# Patient Record
Sex: Male | Born: 1955 | Race: Black or African American | Hispanic: No | Marital: Single | Smoking: Current every day smoker
Health system: Southern US, Community
[De-identification: ages and names within clinical notes are randomized; demographics above are authoritative.]

## PROBLEM LIST (undated history)

## (undated) DIAGNOSIS — R569 Unspecified convulsions: Secondary | ICD-10-CM

## (undated) DIAGNOSIS — I10 Essential (primary) hypertension: Secondary | ICD-10-CM

## (undated) DIAGNOSIS — F329 Major depressive disorder, single episode, unspecified: Secondary | ICD-10-CM

## (undated) DIAGNOSIS — E785 Hyperlipidemia, unspecified: Secondary | ICD-10-CM

## (undated) DIAGNOSIS — M810 Age-related osteoporosis without current pathological fracture: Secondary | ICD-10-CM

## (undated) DIAGNOSIS — R519 Headache, unspecified: Secondary | ICD-10-CM

## (undated) DIAGNOSIS — M1990 Unspecified osteoarthritis, unspecified site: Secondary | ICD-10-CM

## (undated) DIAGNOSIS — M542 Cervicalgia: Secondary | ICD-10-CM

## (undated) DIAGNOSIS — F1910 Other psychoactive substance abuse, uncomplicated: Secondary | ICD-10-CM

## (undated) DIAGNOSIS — F419 Anxiety disorder, unspecified: Secondary | ICD-10-CM

## (undated) DIAGNOSIS — G8929 Other chronic pain: Secondary | ICD-10-CM

## (undated) DIAGNOSIS — R51 Headache: Secondary | ICD-10-CM

## (undated) DIAGNOSIS — F32A Depression, unspecified: Secondary | ICD-10-CM

## (undated) DIAGNOSIS — M549 Dorsalgia, unspecified: Secondary | ICD-10-CM

## (undated) DIAGNOSIS — G40909 Epilepsy, unspecified, not intractable, without status epilepticus: Secondary | ICD-10-CM

## (undated) DIAGNOSIS — F4310 Post-traumatic stress disorder, unspecified: Secondary | ICD-10-CM

## (undated) DIAGNOSIS — K219 Gastro-esophageal reflux disease without esophagitis: Secondary | ICD-10-CM

## (undated) HISTORY — DX: Unspecified osteoarthritis, unspecified site: M19.90

## (undated) HISTORY — DX: Essential (primary) hypertension: I10

## (undated) HISTORY — DX: Hyperlipidemia, unspecified: E78.5

## (undated) HISTORY — DX: Anxiety disorder, unspecified: F41.9

## (undated) HISTORY — DX: Age-related osteoporosis without current pathological fracture: M81.0

## (undated) HISTORY — DX: Other chronic pain: G89.29

## (undated) HISTORY — DX: Unspecified convulsions: R56.9

## (undated) HISTORY — DX: Post-traumatic stress disorder, unspecified: F43.10

## (undated) HISTORY — DX: Gastro-esophageal reflux disease without esophagitis: K21.9

## (undated) HISTORY — DX: Headache, unspecified: R51.9

## (undated) HISTORY — DX: Other psychoactive substance abuse, uncomplicated: F19.10

## (undated) HISTORY — DX: Cervicalgia: M54.2

## (undated) HISTORY — DX: Depression, unspecified: F32.A

## (undated) HISTORY — DX: Headache: R51

## (undated) HISTORY — DX: Major depressive disorder, single episode, unspecified: F32.9

## (undated) HISTORY — PX: OTHER SURGICAL HISTORY: SHX169

## (undated) HISTORY — DX: Dorsalgia, unspecified: M54.9

---

## 1999-10-12 ENCOUNTER — Encounter: Payer: Self-pay | Admitting: Emergency Medicine

## 1999-10-12 ENCOUNTER — Emergency Department (HOSPITAL_COMMUNITY): Admission: EM | Admit: 1999-10-12 | Discharge: 1999-10-12 | Payer: Self-pay | Admitting: Emergency Medicine

## 2000-02-29 ENCOUNTER — Emergency Department (HOSPITAL_COMMUNITY): Admission: EM | Admit: 2000-02-29 | Discharge: 2000-02-29 | Payer: Self-pay | Admitting: Emergency Medicine

## 2000-03-30 ENCOUNTER — Encounter: Payer: Self-pay | Admitting: Emergency Medicine

## 2000-03-30 ENCOUNTER — Emergency Department (HOSPITAL_COMMUNITY): Admission: EM | Admit: 2000-03-30 | Discharge: 2000-03-30 | Payer: Self-pay

## 2001-04-21 ENCOUNTER — Emergency Department (HOSPITAL_COMMUNITY): Admission: EM | Admit: 2001-04-21 | Discharge: 2001-04-21 | Payer: Self-pay | Admitting: Emergency Medicine

## 2001-04-21 ENCOUNTER — Encounter: Payer: Self-pay | Admitting: Emergency Medicine

## 2001-04-21 ENCOUNTER — Encounter: Payer: Self-pay | Admitting: Internal Medicine

## 2002-03-14 ENCOUNTER — Emergency Department (HOSPITAL_COMMUNITY): Admission: EM | Admit: 2002-03-14 | Discharge: 2002-03-14 | Payer: Self-pay | Admitting: Emergency Medicine

## 2004-03-20 ENCOUNTER — Emergency Department (HOSPITAL_COMMUNITY): Admission: EM | Admit: 2004-03-20 | Discharge: 2004-03-21 | Payer: Self-pay | Admitting: Family Medicine

## 2012-04-27 ENCOUNTER — Emergency Department (HOSPITAL_COMMUNITY): Payer: Self-pay

## 2012-04-27 ENCOUNTER — Emergency Department (HOSPITAL_COMMUNITY)
Admission: EM | Admit: 2012-04-27 | Discharge: 2012-04-27 | Disposition: A | Discharge status: Home / Self Care | Payer: Self-pay | Attending: Emergency Medicine | Admitting: Emergency Medicine

## 2012-04-27 ENCOUNTER — Encounter (HOSPITAL_COMMUNITY): Payer: Self-pay | Admitting: *Deleted

## 2012-04-27 DIAGNOSIS — S21119A Laceration without foreign body of unspecified front wall of thorax without penetration into thoracic cavity, initial encounter: Secondary | ICD-10-CM

## 2012-04-27 DIAGNOSIS — R079 Chest pain, unspecified: Secondary | ICD-10-CM | POA: Insufficient documentation

## 2012-04-27 DIAGNOSIS — S21109A Unspecified open wound of unspecified front wall of thorax without penetration into thoracic cavity, initial encounter: Secondary | ICD-10-CM | POA: Insufficient documentation

## 2012-04-27 DIAGNOSIS — G40909 Epilepsy, unspecified, not intractable, without status epilepticus: Secondary | ICD-10-CM | POA: Insufficient documentation

## 2012-04-27 HISTORY — DX: Epilepsy, unspecified, not intractable, without status epilepticus: G40.909

## 2012-04-27 MED ORDER — NAPROXEN 500 MG PO TABS: 500.0000 mg | ORAL_TABLET | Freq: Two times a day (BID) | ORAL | Status: AC

## 2012-04-27 MED ORDER — IBUPROFEN 200 MG PO TABS
600.0000 mg | ORAL_TABLET | Freq: Once | ORAL | Status: AC
  Administered 2012-04-27: 600 mg via ORAL
  Filled 2012-04-27: qty 3

## 2012-04-27 NOTE — ED Notes (Signed)
[employer] at the bedside 

## 2012-04-27 NOTE — ED Notes (Signed)
Wound assessed by EDP Hyacinth Meeker not level trauma

## 2012-04-27 NOTE — ED Notes (Signed)
Pt. Discharged to home, pt. Alert and oriented, respirations even and regular

## 2012-04-27 NOTE — Discharge Instructions (Signed)
Laceration Care, Adult °A laceration is a cut that goes through all layers of the skin. The cut goes into the tissue beneath the skin. °HOME CARE °For stitches (sutures) or staples: °· Keep the cut clean and dry.  °· If you have a bandage (dressing), change it at least once a day. Change the bandage if it gets wet or dirty, or as told by your doctor.  °· Wash the cut with soap and water 2 times a day. Rinse the cut with water. Pat it dry with a clean towel.  °· Put a thin layer of medicated cream on the cut as told by your doctor.  °· You may shower after the first 24 hours. Do not soak the cut in water until the stitches are removed.  °· Only take medicines as told by your doctor.  °· Have your stitches or staples removed as told by your doctor.  °For skin adhesive strips: °· Keep the cut clean and dry.  °· Do not get the strips wet. You may take a bath, but be careful to keep the cut dry.  °· If the cut gets wet, pat it dry with a clean towel.  °· The strips will fall off on their own. Do not remove the strips that are still stuck to the cut.  °For wound glue: °· You may shower or take baths. Do not soak or scrub the cut. Do not swim. Avoid heavy sweating until the glue falls off on its own. After a shower or bath, pat the cut dry with a clean towel.  °· Do not put medicine on your cut until the glue falls off.  °· If you have a bandage, do not put tape over the glue.  °· Avoid lots of sunlight or tanning lamps until the glue falls off. Put sunscreen on the cut for the first year to reduce your scar.  °· The glue will fall off on its own. Do not pick at the glue.  °You may need a tetanus shot if: °· You cannot remember when you had your last tetanus shot.  °· You have never had a tetanus shot.  °If you need a tetanus shot and you choose not to have one, you may get tetanus. Sickness from tetanus can be serious. °GET HELP RIGHT AWAY IF:  °· Your pain does not get better with medicine.  °· Your arm, hand, leg, or  foot loses feeling (numbness) or changes color.  °· Your cut is bleeding.  °· Your joint feels weak, or you cannot use your joint.  °· You have painful lumps on your body.  °· Your cut is red, puffy (swollen), or painful.  °· You have a red line on the skin near the cut.  °· You have yellowish-white fluid (pus) coming from the cut.  °· You have a fever.  °· You have a bad smell coming from the cut or bandage.  °· Your cut breaks open before or after stitches are removed.  °· You notice something coming out of the cut, such as wood or glass.  °· You cannot move a finger or toe.  °MAKE SURE YOU:  °· Understand these instructions.  °· Will watch your condition.  °· Will get help right away if you are not doing well or get worse.  °Document Released: 05/31/2008 Document Revised: 12/02/2011 Document Reviewed: 06/08/2011 °[employer]® Patient Information ©2012 [employer]. °
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
## 2013-11-07 ENCOUNTER — Encounter (HOSPITAL_COMMUNITY): Payer: Self-pay | Admitting: Emergency Medicine

## 2013-11-07 ENCOUNTER — Emergency Department (HOSPITAL_COMMUNITY): Payer: Self-pay

## 2013-11-07 DIAGNOSIS — S298XXA Other specified injuries of thorax, initial encounter: Secondary | ICD-10-CM | POA: Insufficient documentation

## 2013-11-07 DIAGNOSIS — Z79899 Other long term (current) drug therapy: Secondary | ICD-10-CM | POA: Insufficient documentation

## 2013-11-07 DIAGNOSIS — S0003XA Contusion of scalp, initial encounter: Secondary | ICD-10-CM | POA: Insufficient documentation

## 2013-11-07 DIAGNOSIS — G40909 Epilepsy, unspecified, not intractable, without status epilepticus: Secondary | ICD-10-CM | POA: Insufficient documentation

## 2013-11-07 DIAGNOSIS — H05229 Edema of unspecified orbit: Secondary | ICD-10-CM | POA: Insufficient documentation

## 2013-11-07 DIAGNOSIS — F17200 Nicotine dependence, unspecified, uncomplicated: Secondary | ICD-10-CM | POA: Insufficient documentation

## 2013-11-07 DIAGNOSIS — S022XXA Fracture of nasal bones, initial encounter for closed fracture: Secondary | ICD-10-CM | POA: Insufficient documentation

## 2013-11-07 DIAGNOSIS — S0990XA Unspecified injury of head, initial encounter: Secondary | ICD-10-CM | POA: Insufficient documentation

## 2013-11-07 NOTE — ED Notes (Signed)
Pt out of lobby in radiology at this time

## 2013-11-07 NOTE — ED Notes (Signed)
Xray called and indicated facial bones may not be adequate imaging at this time, will hold off on xrays at this time until seen by EDP
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
## 2013-11-08 ENCOUNTER — Emergency Department (HOSPITAL_COMMUNITY)
Admission: EM | Admit: 2013-11-08 | Discharge: 2013-11-08 | Disposition: A | Discharge status: Home / Self Care | Payer: Self-pay | Attending: Emergency Medicine | Admitting: Emergency Medicine

## 2013-11-08 DIAGNOSIS — S022XXA Fracture of nasal bones, initial encounter for closed fracture: Secondary | ICD-10-CM

## 2013-11-08 DIAGNOSIS — H05221 Edema of right orbit: Secondary | ICD-10-CM

## 2013-11-08 DIAGNOSIS — S0083XA Contusion of other part of head, initial encounter: Secondary | ICD-10-CM

## 2013-11-08 MED ORDER — HYDROCODONE-ACETAMINOPHEN 5-325 MG PO TABS: 2.0000 | ORAL_TABLET | ORAL | Status: DC | PRN

## 2013-11-08 MED ORDER — HYDROCODONE-ACETAMINOPHEN 5-325 MG PO TABS
2.0000 | ORAL_TABLET | Freq: Once | ORAL | Status: AC
  Administered 2013-11-08: 2 via ORAL
  Filled 2013-11-08: qty 2
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
## 2013-11-08 NOTE — ED Notes (Signed)
Pt son, Julious Payer, can be reached at [phone]

## 2013-12-15 ENCOUNTER — Encounter (HOSPITAL_COMMUNITY): Payer: Self-pay | Admitting: Emergency Medicine

## 2013-12-15 ENCOUNTER — Emergency Department (HOSPITAL_COMMUNITY)
Admission: EM | Admit: 2013-12-15 | Discharge: 2013-12-15 | Disposition: A | Discharge status: Home / Self Care | Payer: No Typology Code available for payment source | Attending: Emergency Medicine | Admitting: Emergency Medicine

## 2013-12-15 ENCOUNTER — Emergency Department (HOSPITAL_COMMUNITY): Payer: Self-pay

## 2013-12-15 ENCOUNTER — Emergency Department (HOSPITAL_COMMUNITY): Payer: No Typology Code available for payment source

## 2013-12-15 DIAGNOSIS — S161XXA Strain of muscle, fascia and tendon at neck level, initial encounter: Secondary | ICD-10-CM

## 2013-12-15 DIAGNOSIS — Y92410 Unspecified street and highway as the place of occurrence of the external cause: Secondary | ICD-10-CM | POA: Insufficient documentation

## 2013-12-15 DIAGNOSIS — S39012A Strain of muscle, fascia and tendon of lower back, initial encounter: Secondary | ICD-10-CM

## 2013-12-15 DIAGNOSIS — S139XXA Sprain of joints and ligaments of unspecified parts of neck, initial encounter: Secondary | ICD-10-CM | POA: Insufficient documentation

## 2013-12-15 DIAGNOSIS — Z79899 Other long term (current) drug therapy: Secondary | ICD-10-CM | POA: Insufficient documentation

## 2013-12-15 DIAGNOSIS — Y9389 Activity, other specified: Secondary | ICD-10-CM | POA: Insufficient documentation

## 2013-12-15 DIAGNOSIS — IMO0002 Reserved for concepts with insufficient information to code with codable children: Secondary | ICD-10-CM | POA: Insufficient documentation

## 2013-12-15 DIAGNOSIS — S335XXA Sprain of ligaments of lumbar spine, initial encounter: Secondary | ICD-10-CM | POA: Insufficient documentation

## 2013-12-15 DIAGNOSIS — G40909 Epilepsy, unspecified, not intractable, without status epilepticus: Secondary | ICD-10-CM | POA: Insufficient documentation

## 2013-12-15 DIAGNOSIS — F17200 Nicotine dependence, unspecified, uncomplicated: Secondary | ICD-10-CM | POA: Insufficient documentation

## 2013-12-15 DIAGNOSIS — S61209A Unspecified open wound of unspecified finger without damage to nail, initial encounter: Secondary | ICD-10-CM | POA: Insufficient documentation

## 2013-12-15 DIAGNOSIS — S61219A Laceration without foreign body of unspecified finger without damage to nail, initial encounter: Secondary | ICD-10-CM

## 2013-12-15 MED ORDER — CYCLOBENZAPRINE HCL 5 MG PO TABS
5.0000 mg | ORAL_TABLET | Freq: Two times a day (BID) | ORAL | Status: DC | PRN
Start: 2013-12-15 — End: 2023-11-12

## 2013-12-15 MED ORDER — HYDROCODONE-ACETAMINOPHEN 5-325 MG PO TABS: 1.0000 | ORAL_TABLET | ORAL | Status: DC | PRN
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
## 2013-12-15 NOTE — ED Provider Notes (Signed)
Medical screening examination/treatment/procedure(s) were performed by non-physician practitioner and as supervising physician I was immediately available for consultation/collaboration.  EKG Interpretation   None         Charles B. Bernette Mayers, MD 12/15/13 [phone]
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
## 2015-09-15 ENCOUNTER — Encounter (HOSPITAL_COMMUNITY): Payer: Self-pay | Admitting: Emergency Medicine

## 2015-09-15 ENCOUNTER — Emergency Department (HOSPITAL_COMMUNITY)
Admission: EM | Admit: 2015-09-15 | Discharge: 2015-09-15 | Disposition: A | Discharge status: Home / Self Care | Payer: Self-pay | Attending: Emergency Medicine | Admitting: Emergency Medicine

## 2015-09-15 DIAGNOSIS — Z7982 Long term (current) use of aspirin: Secondary | ICD-10-CM | POA: Insufficient documentation

## 2015-09-15 DIAGNOSIS — Z79899 Other long term (current) drug therapy: Secondary | ICD-10-CM | POA: Insufficient documentation

## 2015-09-15 DIAGNOSIS — K2970 Gastritis, unspecified, without bleeding: Secondary | ICD-10-CM

## 2015-09-15 DIAGNOSIS — Z720 Tobacco use: Secondary | ICD-10-CM | POA: Insufficient documentation

## 2015-09-15 LAB — CBC
HEMATOCRIT: 42 % (ref 39.0–52.0)
HEMOGLOBIN: 14.2 g/dL (ref 13.0–17.0)
MCH: 29.6 pg (ref 26.0–34.0)
MCHC: 33.8 g/dL (ref 30.0–36.0)
MCV: 87.7 fL (ref 78.0–100.0)
Platelets: 214 10*3/uL (ref 150–400)
RBC: 4.79 MIL/uL (ref 4.22–5.81)
RDW: 13.6 % (ref 11.5–15.5)
WBC: 10.1 10*3/uL (ref 4.0–10.5)

## 2015-09-15 LAB — URINE MICROSCOPIC-ADD ON

## 2015-09-15 LAB — URINALYSIS, ROUTINE W REFLEX MICROSCOPIC
Glucose, UA: NEGATIVE mg/dL
HGB URINE DIPSTICK: NEGATIVE
Ketones, ur: NEGATIVE mg/dL
Nitrite: NEGATIVE
PH: 5.5 (ref 5.0–8.0)
Protein, ur: NEGATIVE mg/dL
SPECIFIC GRAVITY, URINE: 1.031 — AB (ref 1.005–1.030)
UROBILINOGEN UA: 0.2 mg/dL (ref 0.0–1.0)

## 2015-09-15 LAB — COMPREHENSIVE METABOLIC PANEL
ALK PHOS: 69 U/L (ref 38–126)
ALT: 15 U/L — ABNORMAL LOW (ref 17–63)
ANION GAP: 9 (ref 5–15)
AST: 17 U/L (ref 15–41)
Albumin: 4 g/dL (ref 3.5–5.0)
BUN: 10 mg/dL (ref 6–20)
CHLORIDE: 109 mmol/L (ref 101–111)
CO2: 22 mmol/L (ref 22–32)
CREATININE: 0.89 mg/dL (ref 0.61–1.24)
Calcium: 9.5 mg/dL (ref 8.9–10.3)
GLUCOSE: 99 mg/dL (ref 65–99)
POTASSIUM: 3.6 mmol/L (ref 3.5–5.1)
SODIUM: 140 mmol/L (ref 135–145)
Total Bilirubin: 0.8 mg/dL (ref 0.3–1.2)
Total Protein: 7.3 g/dL (ref 6.5–8.1)

## 2015-09-15 LAB — LIPASE, BLOOD: Lipase: 21 U/L — ABNORMAL LOW (ref 22–51)

## 2015-09-15 LAB — POC OCCULT BLOOD, ED: Fecal Occult Bld: NEGATIVE

## 2015-09-15 MED ORDER — SODIUM CHLORIDE 0.9 % IV BOLUS (SEPSIS)
1000.0000 mL | Freq: Once | INTRAVENOUS | Status: AC
  Administered 2015-09-15: 1000 mL via INTRAVENOUS

## 2015-09-15 MED ORDER — PANTOPRAZOLE SODIUM 40 MG IV SOLR
40.0000 mg | Freq: Once | INTRAVENOUS | Status: AC
  Administered 2015-09-15: 40 mg via INTRAVENOUS
  Filled 2015-09-15: qty 40

## 2015-09-15 MED ORDER — ONDANSETRON HCL 4 MG PO TABS: 4.0000 mg | ORAL_TABLET | Freq: Four times a day (QID) | ORAL | Status: DC

## 2015-09-15 MED ORDER — ONDANSETRON HCL 4 MG/2ML IJ SOLN
4.0000 mg | Freq: Once | INTRAMUSCULAR | Status: AC
  Administered 2015-09-15: 4 mg via INTRAVENOUS
  Filled 2015-09-15: qty 2

## 2015-09-15 NOTE — ED Notes (Signed)
Patient states he has nausea and emesis since last Sunday.   Patient states he thinks he may have an ulcer.   Patient denies abdominal pain or other symptoms.

## 2015-09-15 NOTE — Discharge Instructions (Signed)
Gastritis, Adult Gastritis is soreness and puffiness (inflammation) of the lining of the stomach. If you do not get help, gastritis can cause bleeding and sores (ulcers) in the stomach. HOME CARE   Only take medicine as told by your doctor.  If you were given antibiotic medicines, take them as told. Finish the medicines even if you start to feel better.  Drink enough fluids to keep your pee (urine) clear or pale yellow.  Avoid foods and drinks that make your problems worse. Foods you may want to avoid include:  Caffeine or alcohol.  Chocolate.  Mint.  Garlic and onions.  Spicy foods.  Citrus fruits, including oranges, lemons, or limes.  Food containing tomatoes, including sauce, chili, salsa, and pizza.  Fried and fatty foods.  Eat small meals throughout the day instead of large meals. GET HELP RIGHT AWAY IF:   You have black or dark red poop (stools).  You throw up (vomit) blood. It may look like coffee grounds.  You cannot keep fluids down.  Your belly (abdominal) pain gets worse.  You have a fever.  You do not feel better after 1 week.  You have any other questions or concerns. MAKE SURE YOU:   Understand these instructions.  Will watch your condition.  Will get help right away if you are not doing well or get worse. Document Released: 05/31/2008 Document Revised: 03/06/2012 Document Reviewed: 01/26/2012 [facility] Patient Information 2015 [place], [place]. This information is not intended to replace advice given to you by your health care provider. Make sure you discuss any questions you have with your health care provider.  Follow-up with GI if needed. Return to the emergency department if you experience fevers, bloody vomit, diarrhea, shortness of breath, numbness, weakness.

## 2015-09-15 NOTE — ED Provider Notes (Signed)
CSN: [id number]     Arrival date & time 09/15/15  0174 History   First MD Initiated Contact with Patient 09/15/15 0827     Chief Complaint  Patient presents with  . Nausea  . Emesis     (Consider location/radiation/quality/duration/timing/severity/associated sxs/prior Treatment) HPI Comments: Lance Walters is a 59 y.o M with a history of recent incarceration, epilepsy, PTSD who presents the emergency department today complaining of nausea, vomiting 1 week. Patient states that one week ago he began to feel very nauseous and has vomited 3 times since then. Nausea still persists. Patient states his emesis appeared grossly red "looks like catchup". Patient has had 2-3 bowel movements in the last week. All appearing dark black and sticky. Last episode of vomiting was last night. Patient states he felt a burning sensation in his esophagus so he took some antacid which provided complete relief of burning sensation. Denies history of gastric ulcers. Patient has never had a colonoscopy. Denies fever, shortness of breath, lightheadedness, dizziness, numbness, weakness, chest pain, syncope, abdominal pain, headache, difficulty swallowing.  Patient is a 59 y.o. male presenting with vomiting. The history is provided by the patient.  Emesis   Past Medical History  Diagnosis Date  . Epilepsy   . Epilepsy    History reviewed. No pertinent past surgical history. No family history on file. Social History  Substance Use Topics  . Smoking status: Current Every Day Smoker -- 1.00 packs/day    Types: Cigarettes  . Smokeless tobacco: Never Used  . Alcohol Use: No    Review of Systems  Gastrointestinal: Positive for vomiting.  Genitourinary: Negative for dysuria.  All other systems reviewed and are negative.     Allergies  Review of patient's allergies indicates no known allergies.  Home Medications   Prior to Admission medications   Medication Sig Start Date End Date Taking? Authorizing  Provider  Aspirin-Salicylamide-Caffeine (BC HEADACHE POWDER PO) Take 1 Package by mouth as needed (headache).    Historical Provider, MD  cyclobenzaprine (FLEXERIL) 5 MG tablet Take 1 tablet (5 mg total) by mouth 2 (two) times daily as needed for muscle spasms. 12/15/13   Glendell Docker, NP  HYDROcodone-acetaminophen (NORCO/VICODIN) 5-325 MG per tablet Take 1-2 tablets by mouth every 4 (four) hours as needed. 12/15/13   Glendell Docker, NP  phenytoin (DILANTIN) 100 MG ER capsule Take 100 mg by mouth 3 (three) times daily.    Historical Provider, MD   BP 131/84 mmHg  Pulse 66  Temp(Src) 98.1 F (36.7 C) (Oral)  Resp 22  Wt 218 lb (98.884 kg)  SpO2 99% Physical Exam  Constitutional: He is oriented to person, place, and time. He appears well-developed and well-nourished. No distress.  HENT:  Head: Normocephalic and atraumatic.  Mouth/Throat: Oropharynx is clear and moist. No oropharyngeal exudate.  Eyes: Conjunctivae and EOM are normal. Pupils are equal, round, and reactive to light. Right eye exhibits no discharge. Left eye exhibits no discharge. No scleral icterus.  Neck: Normal range of motion. Neck supple.  No meningismus.  Cardiovascular: Normal rate, regular rhythm, normal heart sounds and intact distal pulses.  Exam reveals no gallop and no friction rub.   No murmur heard. Pulmonary/Chest: Effort normal and breath sounds normal. No respiratory distress. He has no wheezes. He has no rales. He exhibits no tenderness.  Abdominal: Soft. Bowel sounds are normal. He exhibits no distension and no mass. There is no tenderness. There is no rebound and no guarding.  Negative Murphy sign. No peritoneal  signs.  Genitourinary: Rectum normal and prostate normal.  Good rectal tone. Minimal stool in rectal vault.  Musculoskeletal: Normal range of motion. He exhibits no edema.  Neurological: He is alert and oriented to person, place, and time. No cranial nerve deficit.  Strength 5/5 throughout.  No sensory deficits.    Skin: Skin is warm and dry. No rash noted. He is not diaphoretic. No erythema. No pallor.  Psychiatric: He has a normal mood and affect. His behavior is normal.  Nursing note and vitals reviewed.   ED Course  Procedures (including critical care time) Labs Review Labs Reviewed  LIPASE, BLOOD - Abnormal; Notable for the following:    Lipase 21 (*)    All other components within normal limits  COMPREHENSIVE METABOLIC PANEL - Abnormal; Notable for the following:    ALT 15 (*)    All other components within normal limits  URINALYSIS, ROUTINE W REFLEX MICROSCOPIC (NOT AT [facility]) - Abnormal; Notable for the following:    Color, Urine AMBER (*)    Specific Gravity, Urine 1.031 (*)    Bilirubin Urine SMALL (*)    Leukocytes, UA SMALL (*)    All other components within normal limits  URINE MICROSCOPIC-ADD ON - Abnormal; Notable for the following:    Squamous Epithelial / LPF FEW (*)    Bacteria, UA FEW (*)    Crystals CA OXALATE CRYSTALS (*)    All other components within normal limits  CBC  POC OCCULT BLOOD, ED    Imaging Review No results found. I have personally reviewed and evaluated these images and lab results as part of my medical decision-making.   EKG Interpretation None      MDM   Final diagnoses:  Gastritis    Patient seen for nausea, emesis, dark stools 1 week. Concern for GI bleed. She given IV fluids, Zofran, IV Protonix. Hemoccult negative. Patient's symptoms resolved after receiving medication. Hemoglobin stable. Do not suspect acute bleed. Hemodynamically stable. VSS. As best treatment plan with patient who is agreeable. Return precautions outlined in patient discharge instructions.  Case management consult to due to patient's homelessness and inability to obtain medications.  Patient was discussed with and seen by Dr. Alfonse Spruce who agrees with the treatment plan.      Dondra Spry Marquez, PA-C 09/15/15 1528  Harvel Quale,  MD 09/15/15 [phone]

## 2015-09-15 NOTE — Care Management Note (Signed)
Case Management Note  Patient Details  Name: Lance Walters MRN: [id number] Date of Birth: 1956-11-19  Subjective/Objective:                  59 y.o M with a history of recent incarceration, epilepsy, PTSD who presents the emergency department today complaining of nausea, vomiting 1 week. //Lives at homeless shelter  Action/Plan: Follow for disposition needs.  Expected Discharge Date:        09/15/15          Expected Discharge Plan:  Homeless Shelter  In-House Referral:  PCP / [facility]  Discharge planning Services  [place] / [facility] (established/new), CM Consult  Post Acute Care Choice:  NA Choice offered to:  NA  DME Arranged:    DME Agency:     HH Arranged:  NA HH Agency:     Status of Service:  Completed, signed off  Medicare Important Message Given:    Date Medicare IM Given:    Medicare IM give by:    Date Additional Medicare IM Given:    Additional Medicare Important Message give by:     If discussed at [place] of Stay Meetings, dates discussed:    Additional Comments: NCM consulted to regarding medications. Pt is established at [facility] and [place].  NCM consulted Saintclair Halsted, [place] Specialist to assist pt with [facility] application process.  Fuller Mandril, RN 09/15/2015, 10:20 AM

## 2016-01-29 ENCOUNTER — Encounter: Payer: Self-pay | Admitting: Internal Medicine

## 2016-02-13 ENCOUNTER — Ambulatory Visit (AMBULATORY_SURGERY_CENTER): Payer: Self-pay | Admitting: *Deleted

## 2016-02-13 ENCOUNTER — Telehealth: Payer: Self-pay | Admitting: *Deleted

## 2016-02-13 VITALS — Ht 71.0 in | Wt 180.0 lb

## 2016-02-13 DIAGNOSIS — Z1211 Encounter for screening for malignant neoplasm of colon: Secondary | ICD-10-CM

## 2016-02-13 MED ORDER — SUPREP BOWEL PREP KIT 17.5-3.13-1.6 GM/177ML PO SOLN: 1.0000 | Freq: Once | ORAL | Status: DC

## 2016-02-13 NOTE — Telephone Encounter (Signed)
John,  I had a PV today with this patient.  He has a history of seizures but none since 2015 when he was placed on Keppra.  I wanted to let you know that he uses cocaine on weekends.  Last use was 02/07/16.  [place] for colonoscopy?  Please advise.

## 2016-02-13 NOTE — Progress Notes (Signed)
Patient denies any allergies to egg or soy products. No prior surgical history. Patient denies oxygen use at home and denies diet medications. Emmi instructions for colonoscopy explained but patient denied.

## 2016-02-16 NOTE — Telephone Encounter (Signed)
Lance Walters,  This pt is cleared for anesthetic care at [facility].  Thanks,  Osvaldo Angst

## 2016-02-26 ENCOUNTER — Encounter: Payer: Medicaid Other | Admitting: Internal Medicine

## 2016-02-26 ENCOUNTER — Telehealth: Payer: Self-pay | Admitting: Internal Medicine

## 2016-02-27 ENCOUNTER — Encounter: Payer: Self-pay | Admitting: Internal Medicine

## 2016-04-01 NOTE — Telephone Encounter (Signed)
Due to Insurance Pt NOT BILLED Colon Cx Fee

## 2016-04-20 ENCOUNTER — Encounter: Payer: Medicaid Other | Admitting: Internal Medicine

## 2016-05-03 ENCOUNTER — Encounter: Payer: Self-pay | Admitting: Specialist

## 2018-01-27 ENCOUNTER — Encounter: Payer: Self-pay | Admitting: Pediatric Intensive Care

## 2018-01-30 ENCOUNTER — Encounter: Payer: Self-pay | Admitting: Pediatric Intensive Care

## 2018-01-30 LAB — TB SKIN TEST: TB SKIN TEST: NEGATIVE

## 2018-02-14 NOTE — Congregational Nurse Program (Signed)
Congregational Nurse Program Note  Date of Encounter: 01/27/2018  Past Medical History: Past Medical History:  Diagnosis Date  . Anxiety   . Chronic neck and back pain   . Depression   . Epilepsy ([place])   . Epilepsy ([place])   . Frequent headaches   . GERD (gastroesophageal reflux disease)   . Hyperlipidemia    diet controlled, no medication  . PTSD (post-traumatic stress disorder)   . Seizures ([place])    last one 2015, controlled with med   . Substance abuse    history cocaine use    Encounter Details: CNP Questionnaire - 01/27/18 0900      Questionnaire   Patient Status  Not Applicable    Race  Black or African American    Location Patient Served At  [employer]  Not Applicable    Uninsured  Uninsured (NEW 1x/quarter)    Food  Yes, have food insecurities    Housing/Utilities  No permanent housing    Transportation  No transportation needs    Interpersonal Safety  Yes, feel physically and emotionally safe where you currently live    Medication  No medication insecurities    Medical Provider  Yes    Referrals  Not Applicable    ED Visit Averted  Not Applicable    Life-Saving Intervention Made  Not Applicable     Needs PPD placement for housing. Placed per protocol in right forearm. Client advised to RTC on Monday for reading.

## 2018-02-14 NOTE — Congregational Nurse Program (Signed)
Congregational Nurse Program Note  Date of Encounter: 01/30/2018  Past Medical History: Past Medical History:  Diagnosis Date  . Anxiety   . Chronic neck and back pain   . Depression   . Epilepsy ([place])   . Epilepsy ([place])   . Frequent headaches   . GERD (gastroesophageal reflux disease)   . Hyperlipidemia    diet controlled, no medication  . PTSD (post-traumatic stress disorder)   . Seizures ([place])    last one 2015, controlled with med   . Substance abuse    history cocaine use    Encounter Details: CNP Questionnaire - 01/30/18 0830      Questionnaire   Patient Status  Not Applicable    Race  Black or African American    Location Patient Served At  [employer]  Not Applicable    Uninsured  Uninsured (Subsequent visits/quarter)    Food  Yes, have food insecurities    Housing/Utilities  No permanent housing    Transportation  No transportation needs    Interpersonal Safety  Yes, feel physically and emotionally safe where you currently live    Medication  No medication insecurities    Medical Provider  Yes    Referrals  Not Applicable    ED Visit Averted  Not Applicable    Life-Saving Intervention Made  Not Applicable     Clien in for TB test read. No induration noted at site. Copy of test results given.

## 2019-07-28 ENCOUNTER — Emergency Department (HOSPITAL_COMMUNITY)
Admission: EM | Admit: 2019-07-28 | Discharge: 2019-07-28 | Discharge status: Against Medical Advice | Payer: Self-pay | Attending: Emergency Medicine | Admitting: Emergency Medicine

## 2019-07-28 ENCOUNTER — Other Ambulatory Visit: Payer: Self-pay

## 2019-07-28 ENCOUNTER — Encounter (HOSPITAL_COMMUNITY): Payer: Self-pay

## 2019-07-28 DIAGNOSIS — Z5321 Procedure and treatment not carried out due to patient leaving prior to being seen by health care provider: Secondary | ICD-10-CM | POA: Insufficient documentation

## 2019-07-28 NOTE — ED Notes (Signed)
Pt called for updated vitals x 1
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
## 2020-08-15 DIAGNOSIS — I10 Essential (primary) hypertension: Secondary | ICD-10-CM | POA: Insufficient documentation

## 2020-08-15 DIAGNOSIS — G40909 Epilepsy, unspecified, not intractable, without status epilepticus: Secondary | ICD-10-CM | POA: Insufficient documentation

## 2020-08-15 DIAGNOSIS — N400 Enlarged prostate without lower urinary tract symptoms: Secondary | ICD-10-CM | POA: Insufficient documentation

## 2020-08-15 DIAGNOSIS — M549 Dorsalgia, unspecified: Secondary | ICD-10-CM | POA: Insufficient documentation

## 2020-08-15 DIAGNOSIS — F1990 Other psychoactive substance use, unspecified, uncomplicated: Secondary | ICD-10-CM | POA: Insufficient documentation

## 2020-08-15 DIAGNOSIS — F1490 Cocaine use, unspecified, uncomplicated: Secondary | ICD-10-CM | POA: Insufficient documentation

## 2020-09-18 DIAGNOSIS — M4802 Spinal stenosis, cervical region: Secondary | ICD-10-CM | POA: Insufficient documentation

## 2020-09-18 DIAGNOSIS — R944 Abnormal results of kidney function studies: Secondary | ICD-10-CM | POA: Insufficient documentation

## 2020-09-18 DIAGNOSIS — M4807 Spinal stenosis, lumbosacral region: Secondary | ICD-10-CM | POA: Insufficient documentation

## 2020-09-18 DIAGNOSIS — G629 Polyneuropathy, unspecified: Secondary | ICD-10-CM | POA: Insufficient documentation

## 2020-09-18 DIAGNOSIS — M1611 Unilateral primary osteoarthritis, right hip: Secondary | ICD-10-CM | POA: Insufficient documentation

## 2021-02-04 DIAGNOSIS — G629 Polyneuropathy, unspecified: Secondary | ICD-10-CM | POA: Insufficient documentation

## 2021-09-21 DIAGNOSIS — R195 Other fecal abnormalities: Secondary | ICD-10-CM | POA: Insufficient documentation

## 2021-11-24 LAB — CBC: RBC: 4.94 (ref 3.87–5.11)

## 2021-11-24 LAB — BASIC METABOLIC PANEL
BUN: 18 (ref 4–21)
CO2: 19 (ref 13–22)
Chloride: 105 (ref 99–108)
Creatinine: 1.4 — AB (ref 0.6–1.3)
Glucose: 135
Potassium: 4.3 (ref 3.4–5.3)
Sodium: 139 (ref 137–147)

## 2021-11-24 LAB — CBC AND DIFFERENTIAL
HCT: 45 (ref 41–53)
Hemoglobin: 14.6 (ref 13.5–17.5)
Platelets: 231 (ref 150–399)
WBC: 7.1

## 2021-11-24 LAB — HEPATIC FUNCTION PANEL
ALT: 8 — AB (ref 10–40)
AST: 14 (ref 14–40)
Alkaline Phosphatase: 68 (ref 25–125)
Bilirubin, Total: 0.3

## 2021-11-24 LAB — COMPREHENSIVE METABOLIC PANEL
Albumin: 4.2 (ref 3.5–5.0)
Calcium: 8.7 (ref 8.7–10.7)
GFR calc Af Amer: 58
Globulin: 2.3

## 2021-12-14 DIAGNOSIS — R1032 Left lower quadrant pain: Secondary | ICD-10-CM | POA: Insufficient documentation

## 2021-12-14 DIAGNOSIS — R8281 Pyuria: Secondary | ICD-10-CM | POA: Insufficient documentation

## 2021-12-14 DIAGNOSIS — R159 Full incontinence of feces: Secondary | ICD-10-CM | POA: Insufficient documentation

## 2021-12-17 ENCOUNTER — Other Ambulatory Visit: Payer: Self-pay | Admitting: Physician Assistant

## 2021-12-17 DIAGNOSIS — R1032 Left lower quadrant pain: Secondary | ICD-10-CM

## 2021-12-17 DIAGNOSIS — N390 Urinary tract infection, site not specified: Secondary | ICD-10-CM

## 2021-12-17 DIAGNOSIS — R159 Full incontinence of feces: Secondary | ICD-10-CM

## 2021-12-17 DIAGNOSIS — R8281 Pyuria: Secondary | ICD-10-CM

## 2021-12-17 DIAGNOSIS — R195 Other fecal abnormalities: Secondary | ICD-10-CM

## 2021-12-25 ENCOUNTER — Ambulatory Visit: Payer: Self-pay | Admitting: Nurse Practitioner

## 2021-12-31 ENCOUNTER — Other Ambulatory Visit: Payer: Self-pay

## 2022-01-07 ENCOUNTER — Ambulatory Visit: Payer: Medicare Other | Admitting: Nurse Practitioner

## 2022-01-13 ENCOUNTER — Inpatient Hospital Stay: Admission: RE | Admit: 2022-01-13 | Payer: Self-pay | Source: Ambulatory Visit

## 2022-01-25 ENCOUNTER — Other Ambulatory Visit (INDEPENDENT_AMBULATORY_CARE_PROVIDER_SITE_OTHER): Payer: Medicare Other

## 2022-01-25 ENCOUNTER — Ambulatory Visit (INDEPENDENT_AMBULATORY_CARE_PROVIDER_SITE_OTHER): Payer: Medicare Other | Admitting: Nurse Practitioner

## 2022-01-25 ENCOUNTER — Encounter: Payer: Self-pay | Admitting: Nurse Practitioner

## 2022-01-25 VITALS — BP 130/80 | HR 76 | Ht 69.0 in | Wt 173.0 lb

## 2022-01-25 DIAGNOSIS — R197 Diarrhea, unspecified: Secondary | ICD-10-CM

## 2022-01-25 DIAGNOSIS — K921 Melena: Secondary | ICD-10-CM

## 2022-01-25 DIAGNOSIS — R1030 Lower abdominal pain, unspecified: Secondary | ICD-10-CM

## 2022-01-25 DIAGNOSIS — K625 Hemorrhage of anus and rectum: Secondary | ICD-10-CM

## 2022-01-25 LAB — CBC WITH DIFFERENTIAL/PLATELET
Basophils Absolute: 0.1 10*3/uL (ref 0.0–0.1)
Basophils Relative: 1.1 % (ref 0.0–3.0)
Eosinophils Absolute: 0.1 10*3/uL (ref 0.0–0.7)
Eosinophils Relative: 1.6 % (ref 0.0–5.0)
HCT: 42.3 % (ref 39.0–52.0)
Hemoglobin: 13.9 g/dL (ref 13.0–17.0)
Lymphocytes Relative: 28 % (ref 12.0–46.0)
Lymphs Abs: 1.9 10*3/uL (ref 0.7–4.0)
MCHC: 32.8 g/dL (ref 30.0–36.0)
MCV: 90.2 fl (ref 78.0–100.0)
Monocytes Absolute: 0.5 10*3/uL (ref 0.1–1.0)
Monocytes Relative: 6.7 % (ref 3.0–12.0)
Neutro Abs: 4.3 10*3/uL (ref 1.4–7.7)
Neutrophils Relative %: 62.6 % (ref 43.0–77.0)
Platelets: 183 10*3/uL (ref 150.0–400.0)
RBC: 4.69 Mil/uL (ref 4.22–5.81)
RDW: 13 % (ref 11.5–15.5)
WBC: 6.9 10*3/uL (ref 4.0–10.5)

## 2022-01-25 LAB — COMPREHENSIVE METABOLIC PANEL
ALT: 8 U/L (ref 0–53)
AST: 12 U/L (ref 0–37)
Albumin: 4.1 g/dL (ref 3.5–5.2)
Alkaline Phosphatase: 57 U/L (ref 39–117)
BUN: 27 mg/dL — ABNORMAL HIGH (ref 6–23)
CO2: 24 mEq/L (ref 19–32)
Calcium: 8.9 mg/dL (ref 8.4–10.5)
Chloride: 108 mEq/L (ref 96–112)
Creatinine, Ser: 1.23 mg/dL (ref 0.40–1.50)
GFR: 61.75 mL/min (ref >60.00)
Glucose, Bld: 95 mg/dL (ref 70–99)
Potassium: 4.4 mEq/L (ref 3.5–5.1)
Sodium: 139 mEq/L (ref 135–145)
Total Bilirubin: 0.4 mg/dL (ref 0.2–1.2)
Total Protein: 6.9 g/dL (ref 6.0–8.3)

## 2022-01-25 MED ORDER — NA SULFATE-K SULFATE-MG SULF 17.5-3.13-1.6 GM/177ML PO SOLN: 1.0000 | ORAL | 0 refills | Status: DC

## 2022-01-25 NOTE — Progress Notes (Signed)
01/25/2022 Alphonzo Dublin [id number] 1956-04-19   CHIEF COMPLAINT: Diarrhea, rectal bleeding   HISTORY OF PRESENT ILLNESS: Kamareon Sciandra is a 66 year old male with a past medical history of anxiety, depression, PTSD, epilepsy (last seizure was 20+ years ago), headaches, substance use disorder (abstinent from cocaine x 5 years) and spinal stenosis. He presents to our office today as referred by Particia Nearing PA-C for further evaluation regarding abdominal pain, diarrhea and hematochezia. He previously passed a normal brown formed stool daily. His bowel pattern changed a few months ago when he started passing urgent watery, mud like to pasty loose stools several times daily. He reported seeing bright red blood mixed on the stool, in the toilet water and on the toilet tissue after each bowel movement for 2 months which has decreased over the past few weeks. No noticeable rectal bleeding x 1 to 2 weeks. His bowel movements are urgent at time resulting in a few episodes of soiling himself before he could get to the bathroom. He has intermittent LLQ pain which radiates to his lower back.  He denies having any abdominal pain at this time.  He passed a tarry black loose stool in his depends on Saturday 01/23/2022. No recent Pepto bismol use. No iron use. No dysphagia, heartburn or upper abdominal pain. No history of stomach ulcers. He was recently prescribed an antibiotic for hematuria/suspected UTI. He was scheduled to have a colonoscopy with out practice in 2017 which was cancelled because he was incarcerated. He takes Meloxicam once daily, Tylenol PRN and Gabapentin for lower back pain.  He has night sweats for the past few months.  No weight loss.  No known family history of esophageal, gastric or colon cancer.  CBC Latest Ref Rng & Units 11/24/2021 09/15/2015  WBC - 7.1 10.1  Hemoglobin 13.5 - 17.5 14.6 14.2  Hematocrit 41 - 53 45 42.0  Platelets 150 - 399 231 214    CMP Latest Ref Rng  & Units 11/24/2021 09/15/2015  Glucose 65 - 99 mg/dL - 99  BUN 4 - _0 Creatinine 0.6 - 1.3 1.4(A) 0.89  Sodium 137 - 147 139 140  Potassium 3.4 - 5.3 4.3 3.6  Chloride 99 - 108 105 109  CO2 13 - _1 Calcium 8.7 - 10.7 8.7 9.5  Total Protein 6.5 - 8.1 g/dL - 7.3  Total Bilirubin 0.3 - 1.2 mg/dL - 0.8  Alkaline Phos 25 - 125 68 69  AST 14 - 40 14 17  ALT 10 - 40 8(A) 15(L)     Past Surgical History:  Procedure Laterality Date   no prior surgery     Social History: He is married.  He has 2 daughters and 2 sons.  He is on disability.  He smokes cigarettes 1 ppd x 30 years. Past cocaine use, abstinent x 5 years. He drinks one or two cans of beer once weekly.   Family History: No known family history of esophageal, gastric or colon cancer.  Mother with hypertension and heart disease.  Father with hypertension, heart disease and prostate cancer.  No Known Allergies    Outpatient Encounter Medications as of 01/25/2022  Medication Sig   acetaminophen (TYLENOL) 500 MG tablet Take 500 mg by mouth every 8 (eight) hours as needed for moderate pain.   calcium-vitamin D (OSCAL WITH D) 500-200 MG-UNIT per tablet Take 1 tablet by mouth daily with breakfast.   cyclobenzaprine (FLEXERIL) 5 MG tablet  Take 1 tablet (5 mg total) by mouth 2 (two) times daily as needed for muscle spasms.   gabapentin (NEURONTIN) 300 MG capsule Take 100 mg by mouth 3 (three) times daily.   HydrOXYzine Pamoate (VISTARIL PO) Take 1 capsule by mouth at bedtime.   levETIRAcetam (KEPPRA) 1000 MG tablet Take 1,000 mg by mouth daily.   Meloxicam (MOBIC PO) Take 1 tablet by mouth at bedtime.   Na Sulfate-K Sulfate-Mg Sulf (SUPREP BOWEL PREP KIT) 17.5-3.13-1.6 GM/177ML SOLN Take 1 kit by mouth as directed. For colonoscopy prep   sertraline (ZOLOFT) 50 MG tablet Take 150 mg by mouth daily.   tamsulosin (FLOMAX) 0.4 MG CAPS capsule 0.4 mg.   [DISCONTINUED] omeprazole (PRILOSEC) 20 MG capsule Take 20 mg by mouth daily.    No facility-administered encounter medications on file as of 01/25/2022.    REVIEW OF SYSTEMS:  Gen: Denies fever, sweats or chills. No weight loss.  CV: Denies chest pain, palpitations or edema. Resp: Denies cough, shortness of breath of hemoptysis.  GI: See HPI.  GU : See HPI.  MS: + Lower back pain.  Derm: Denies rash, itchiness, skin lesions or unhealing ulcers. Psych: Denies depression, anxiety, memory loss, suicidal ideation and confusion. Heme: Denies bruising, bleeding. Neuro:  + Spinal stenosis. Endo:  Denies any problems with DM, thyroid or adrenal function.  PHYSICAL EXAM: BP 130/80 (BP Location: Left Arm, Patient Position: Sitting, Cuff Size: Normal)    Pulse 76    Ht _0  (1.753 m) Comment: height measured without shoes   Wt 173 lb (78.5 kg)    BMI 25.55 kg/m   General: 66 year old male in no acute distress. Head: Normocephalic and atraumatic. Eyes:  Sclerae non-icteric, conjunctive pink. Ears: Normal auditory acuity. Mouth: Scattered missing dentition.  No ulcers or lesions.  Neck: Supple, no lymphadenopathy or thyromegaly.  Lungs: Clear bilaterally to auscultation without wheezes, crackles or rhonchi. Heart: Regular rate and rhythm. No murmur, rub or gallop appreciated.  Abdomen: Soft, nontender, non distended. No masses. No hepatosplenomegaly. Normoactive bowel sounds x 4 quadrants.  Rectal: Deferred. Musculoskeletal: Symmetrical with no gross deformities. Skin: Warm and dry. No rash or lesions on visible extremities. Extremities: No edema. Neurological: Alert oriented x 4, no focal deficits.  Psychological:  Alert and cooperative. Normal mood and affect.  ASSESSMENT AND PLAN:  75) 66 year old male with left lower quadrant abdominal pain, diarrhea and hematochezia.  No abdominal tenderness on exam today. -Colonoscopy benefits and risks discussed including risk with sedation, risk of bleeding, perforation and infection  -GI pathogen panel with C. Diff  PCR -CBC, CMP -Phillip's bacteria probiotic once daily -Avoid dairy product -CTAP if LLQ pain recurs -Patient contact office if symptoms worsen  2) Melena x 1. + NSAID use.  -EGD to rule out PUD/UGI malignancy benefits and risks discussed including risk with sedation, risk of bleeding, perforation and infection   3) History of Epilepsy, patient reported well controlled on Keppra. Last seizure was 20+ years ago.   4) Night sweats for the past month without fever or weight loss. -Recommend chest/abdominal/pelvic CT if EGD and colonoscopy unrevealing  Further recommendation to be determined after the above evaluation completed   CC:  Placey, Audrea Muscat, NP

## 2022-01-25 NOTE — Patient Instructions (Signed)
LABS:  Lab work has been ordered for you today. Our lab is located in the basement. Press "B" on the elevator. The lab is located at the first door on the left as you exit the elevator.  HEALTHCARE LAWS AND MY CHART RESULTS:  Due to recent changes in healthcare laws, you may see the results of your imaging and laboratory studies on MyChart before your provider has had a chance to review them.   We understand that in some cases there may be results that are confusing or concerning to you. Not all laboratory results come back in the same time frame and the provider may be waiting for multiple results in order to interpret others.  Please give [place] 48 hours in order for your provider to thoroughly review all the results before contacting the office for clarification of your results.   PROCEDURES: You have been scheduled for an EGD and Colonoscopy. Please follow the written instructions given to you at your visit today. Please pick up your prep supplies at the pharmacy within the next 1-3 days. If you use inhalers (even only as needed), please bring them with you on the day of your procedure.  RECOMMENDATIONS:  Hardin Negus Bacteria Probiotic once a day. No dairy products  It was great seeing you today! Thank you for entrusting me with your care and choosing [facility].  Noralyn Pick, CRNP  The [facility] providers would like to encourage you to use [facility] to communicate with providers for non-urgent requests or questions.  Due to long hold times on the telephone, sending your provider a message by [facility] may be faster and more efficient way to get a response. Please allow 48 business hours for a response.  Please remember that this is for non-urgent requests/questions.  If you are age 21 or older, your body mass index should be between 23-30. Your Body mass index is 25.55 kg/m. If this is out of the aforementioned range listed, please consider follow up with your Primary  Care Provider.  If you are age 45 or younger, your body mass index should be between 19-25. Your Body mass index is 25.55 kg/m. If this is out of the aformentioned range listed, please consider follow up with your Primary Care Provider.

## 2022-01-26 ENCOUNTER — Other Ambulatory Visit: Payer: Medicare Other

## 2022-01-26 DIAGNOSIS — K921 Melena: Secondary | ICD-10-CM

## 2022-01-26 DIAGNOSIS — R1030 Lower abdominal pain, unspecified: Secondary | ICD-10-CM

## 2022-01-26 DIAGNOSIS — R197 Diarrhea, unspecified: Secondary | ICD-10-CM

## 2022-01-26 DIAGNOSIS — K625 Hemorrhage of anus and rectum: Secondary | ICD-10-CM

## 2022-01-28 ENCOUNTER — Telehealth: Payer: Self-pay

## 2022-01-28 NOTE — Telephone Encounter (Signed)
Opened in error

## 2022-01-29 LAB — GI PROFILE, STOOL, PCR

## 2022-01-30 NOTE — Progress Notes (Signed)
Addendum: Reviewed and agree with assessment and management plan. Ellieana Dolecki M, MD  

## 2022-02-03 ENCOUNTER — Telehealth: Payer: Self-pay | Admitting: Nurse Practitioner

## 2022-02-03 NOTE — Telephone Encounter (Signed)
Called patient, phone rang a few times then went to a busy signal.

## 2022-02-03 NOTE — Telephone Encounter (Signed)
-----   Message from Noralyn Pick, NP sent at 01/29/2022  5:58 AM EST ----- Deloris Mittag, pls inform patient his GI pathogen panel was negative. Pls let me know if he is still having diarrhea. Thx

## 2022-02-03 NOTE — Telephone Encounter (Signed)
Inbound call from patient, requesting results from 1/31. Please advise.

## 2022-02-10 NOTE — Telephone Encounter (Signed)
LM for patient

## 2022-02-16 ENCOUNTER — Encounter: Payer: Self-pay | Admitting: Internal Medicine

## 2022-02-16 ENCOUNTER — Ambulatory Visit (AMBULATORY_SURGERY_CENTER): Payer: Medicare Other | Admitting: Internal Medicine

## 2022-02-16 VITALS — BP 136/79 | HR 53 | Temp 98.6°F | Resp 12 | Ht 69.0 in | Wt 173.0 lb

## 2022-02-16 DIAGNOSIS — K2970 Gastritis, unspecified, without bleeding: Secondary | ICD-10-CM

## 2022-02-16 DIAGNOSIS — R1030 Lower abdominal pain, unspecified: Secondary | ICD-10-CM | POA: Diagnosis not present

## 2022-02-16 DIAGNOSIS — K635 Polyp of colon: Secondary | ICD-10-CM

## 2022-02-16 DIAGNOSIS — K921 Melena: Secondary | ICD-10-CM

## 2022-02-16 DIAGNOSIS — K648 Other hemorrhoids: Secondary | ICD-10-CM

## 2022-02-16 DIAGNOSIS — R197 Diarrhea, unspecified: Secondary | ICD-10-CM

## 2022-02-16 DIAGNOSIS — D124 Benign neoplasm of descending colon: Secondary | ICD-10-CM

## 2022-02-16 DIAGNOSIS — K625 Hemorrhage of anus and rectum: Secondary | ICD-10-CM

## 2022-02-16 DIAGNOSIS — K5730 Diverticulosis of large intestine without perforation or abscess without bleeding: Secondary | ICD-10-CM | POA: Diagnosis not present

## 2022-02-16 MED ORDER — SODIUM CHLORIDE 0.9 % IV SOLN: 500.0000 mL | Freq: Once | INTRAVENOUS | Status: DC

## 2022-02-16 NOTE — Patient Instructions (Signed)
Handout on polyps given.   YOU HAD AN ENDOSCOPIC PROCEDURE TODAY AT THE [facility]:   Refer to the procedure report that was given to you for any specific questions about what was found during the examination.  If the procedure report does not answer your questions, please call your gastroenterologist to clarify.  If you requested that your care partner not be given the details of your procedure findings, then the procedure report has been included in a sealed envelope for you to review at your convenience later.  YOU SHOULD EXPECT: Some feelings of bloating in the abdomen. Passage of more gas than usual.  Walking can help get rid of the air that was put into your GI tract during the procedure and reduce the bloating. If you had a lower endoscopy (such as a colonoscopy or flexible sigmoidoscopy) you may notice spotting of blood in your stool or on the toilet paper. If you underwent a bowel prep for your procedure, you may not have a normal bowel movement for a few days.  Please Note:  You might notice some irritation and congestion in your nose or some drainage.  This is from the oxygen used during your procedure.  There is no need for concern and it should clear up in a day or so.  SYMPTOMS TO REPORT IMMEDIATELY:   Following lower endoscopy (colonoscopy or flexible sigmoidoscopy):  Excessive amounts of blood in the stool  Significant tenderness or worsening of abdominal pains  Swelling of the abdomen that is new, acute  Fever of 100F or higher   Following upper endoscopy (EGD)  Vomiting of blood or coffee ground material  New chest pain or pain under the shoulder blades  Painful or persistently difficult swallowing  New shortness of breath  Fever of 100F or higher  Black, tarry-looking stools  For urgent or emergent issues, a gastroenterologist can be reached at any hour by calling [phone]. Do not use MyChart messaging for urgent concerns.    DIET:  We do  recommend a small meal at first, but then you may proceed to your regular diet.  Drink plenty of fluids but you should avoid alcoholic beverages for 24 hours.  ACTIVITY:  You should plan to take it easy for the rest of today and you should NOT DRIVE or use heavy machinery until tomorrow (because of the sedation medicines used during the test).    FOLLOW UP: Our staff will call the number listed on your records 48-72 hours following your procedure to check on you and address any questions or concerns that you may have regarding the information given to you following your procedure. If we do not reach you, we will leave a message.  We will attempt to reach you two times.  During this call, we will ask if you have developed any symptoms of COVID 19. If you develop any symptoms (ie: fever, flu-like symptoms, shortness of breath, cough etc.) before then, please call [phone].  If you test positive for Covid 19 in the 2 weeks post procedure, please call and report this information to us.    If any biopsies were taken you will be contacted by phone or by letter within the next 1-3 weeks.  Please call us at [phone] if you have not heard about the biopsies in 3 weeks.    SIGNATURES/CONFIDENTIALITY: You and/or your care partner have signed paperwork which will be entered into your electronic medical record.  These signatures attest to the fact   that that the information above on your After Visit Summary has been reviewed and is understood.  Full responsibility of the confidentiality of this discharge information lies with you and/or your care-partner. 

## 2022-02-16 NOTE — Progress Notes (Signed)
See office note dated 01/25/2022 for details Patient presenting for upper endoscopy and colonoscopy Evaluation of diarrhea, rectal bleeding, melenic stools  The nature of the procedure, as well as the risks, benefits, and alternatives were carefully and thoroughly reviewed with the patient. Ample time for discussion and questions allowed. The patient understood, was satisfied, and agreed to proceed.

## 2022-02-16 NOTE — Op Note (Signed)
[place] Patient Name: Lance Walters Procedure Date: 02/16/2022 3:57 PM MRN: [id number] Endoscopist: Jerene Bears , MD Age: 66 Referring MD:  Date of Birth: [birth year] Gender: Male Account #: [id number] Procedure:                Colonoscopy Indications:              Lower abdominal pain, Rectal bleeding Medicines:                Monitored Anesthesia Care Procedure:                Pre-Anesthesia Assessment:                           - Prior to the procedure, a History and Physical                            was performed, and patient medications and                            allergies were reviewed. The patient's tolerance of                            previous anesthesia was also reviewed. The risks                            and benefits of the procedure and the sedation                            options and risks were discussed with the patient.                            All questions were answered, and informed consent                            was obtained. Prior Anticoagulants: The patient has                            taken no previous anticoagulant or antiplatelet                            agents. ASA Grade Assessment: II - A patient with                            mild systemic disease. After reviewing the risks                            and benefits, the patient was deemed in                            satisfactory condition to undergo the procedure.                           After obtaining informed consent, the colonoscope  was passed under direct vision. Throughout the                            procedure, the patient's blood pressure, pulse, and                            oxygen saturations were monitored continuously. The                            Olympus CF-HQ190L (Serial# [id number]) Colonoscope was                            introduced through the anus and advanced to the                            cecum, identified by  appendiceal orifice and                            ileocecal valve. The colonoscopy was performed                            without difficulty. The patient tolerated the                            procedure well. The quality of the bowel                            preparation was good. The ileocecal valve,                            appendiceal orifice, and rectum were photographed. Scope In: 4:19:48 PM Scope Out: 4:33:14 PM Scope Withdrawal Time: 0 hours 11 minutes 46 seconds  Total Procedure Duration: 0 hours 13 minutes 26 seconds  Findings:                 The digital rectal exam was normal.                           A 3 mm polyp was found in the descending colon. The                            polyp was sessile. The polyp was removed with a                            cold snare. Resection and retrieval were complete.                           Multiple small and large-mouthed diverticula were                            found in the sigmoid colon, descending colon and                            ascending colon.  Internal hemorrhoids were found during                            retroflexion. The hemorrhoids were small. Complications:            No immediate complications. Estimated Blood Loss:     Estimated blood loss was minimal. Impression:               - One 3 mm polyp in the descending colon, removed                            with a cold snare. Resected and retrieved.                           - Diverticulosis in the sigmoid colon, in the                            descending colon and in the ascending colon.                           - Small internal hemorrhoids. Recommendation:           - Patient has a contact number available for                            emergencies. The signs and symptoms of potential                            delayed complications were discussed with the                            patient. Return to normal activities tomorrow.                             Written discharge instructions were provided to the                            patient.                           - Resume previous diet.                           - Continue present medications.                           - Await pathology results.                           - Repeat colonoscopy is recommended. The                            colonoscopy date will be determined after pathology                            results from today's exam become available for  review. Jerene Bears, MD 02/16/2022 4:39:33 PM This report has been signed electronically.

## 2022-02-16 NOTE — Op Note (Signed)
[place] Patient Name: Lance Walters Procedure Date: 02/16/2022 4:01 PM MRN: [id number] Endoscopist: Jerene Bears , MD Age: 66 Referring MD:  Date of Birth: [birth year] Gender: Male Account #: [id number] Procedure:                Upper GI endoscopy Indications:              Lower abdominal pain, Melena Medicines:                Monitored Anesthesia Care Procedure:                Pre-Anesthesia Assessment:                           - Prior to the procedure, a History and Physical                            was performed, and patient medications and                            allergies were reviewed. The patient's tolerance of                            previous anesthesia was also reviewed. The risks                            and benefits of the procedure and the sedation                            options and risks were discussed with the patient.                            All questions were answered, and informed consent                            was obtained. Prior Anticoagulants: The patient has                            taken no previous anticoagulant or antiplatelet                            agents. ASA Grade Assessment: II - A patient with                            mild systemic disease. After reviewing the risks                            and benefits, the patient was deemed in                            satisfactory condition to undergo the procedure.                           After obtaining informed consent, the endoscope was  passed under direct vision. Throughout the                            procedure, the patient's blood pressure, pulse, and                            oxygen saturations were monitored continuously. The                            Endoscope was introduced through the mouth, and                            advanced to the second part of duodenum. The upper                            GI endoscopy was  accomplished without difficulty.                            The patient tolerated the procedure well. Scope In: Scope Out: Findings:                 A small polypoid lesion was found on the right                            uvula.                           The examined esophagus was normal.                           Mild inflammation characterized by erythema and                            granularity was found in the gastric antrum.                            Biopsies were taken with a cold forceps for                            histology and Helicobacter pylori testing.                           The examined duodenum was normal. Complications:            No immediate complications. Estimated Blood Loss:     Estimated blood loss was minimal. Impression:               - Small polypoid lesion on right uvula.                           - Normal esophagus.                           - Gastritis. Biopsied.                           - Normal examined duodenum.  Recommendation:           - Patient has a contact number available for                            emergencies. The signs and symptoms of potential                            delayed complications were discussed with the                            patient. Return to normal activities tomorrow.                            Written discharge instructions were provided to the                            patient.                           - Resume previous diet.                           - Continue present medications.                           - Await pathology results.                           - ENT referral for further eval of uvula polyp and                            to exclude neoplasia (appears benign at this time).                           - See the other procedure note for documentation of                            additional recommendations. Jerene Bears, MD 02/16/2022 4:37:27 PM This report has been signed electronically.

## 2022-02-16 NOTE — Progress Notes (Signed)
A and O x3. Report to RN. Tolerated MAC anesthesia well. Teeth unchanged after procedure.

## 2022-02-17 ENCOUNTER — Telehealth: Payer: Self-pay

## 2022-02-17 NOTE — Telephone Encounter (Signed)
Referral faxed to ENT office, Dr. Wilburn Cornelia at [phone] for pt to be seen for further eval of uvula polyp to exclude neoplasia.

## 2022-02-18 ENCOUNTER — Telehealth: Payer: Self-pay

## 2022-02-18 ENCOUNTER — Telehealth: Payer: Self-pay | Admitting: *Deleted

## 2022-02-18 NOTE — Telephone Encounter (Signed)
°  Follow up Call-  Call back number 02/16/2022  Post procedure Call Back phone  # [phone]  Permission to leave phone message Yes  Some recent data might be hidden     Patient questions:  Do you have a fever, pain , or abdominal swelling? No. Pain Score  0 *  Have you tolerated food without any problems? Yes.    Have you been able to return to your normal activities? Yes.    Do you have any questions about your discharge instructions: Diet   No. Medications  No. Follow up visit  No.  Do you have questions or concerns about your Care? No.  Actions: * If pain score is 4 or above: No action needed, pain <4.  Have you developed a fever since your procedure? no  2.   Have you had an respiratory symptoms (SOB or cough) since your procedure? no  3.   Have you tested positive for COVID 19 since your procedure no  4.   Have you had any family members/close contacts diagnosed with the COVID 19 since your procedure?  no   If yes to any of these questions please route to Joylene John, RN and Joella Prince, RN

## 2022-02-18 NOTE — Telephone Encounter (Signed)
Follow up call placed, VM obtained and message left. ?SChaplin, RN,BSN ? ?

## 2022-02-25 ENCOUNTER — Encounter: Payer: Self-pay | Admitting: Internal Medicine

## 2022-03-12 DIAGNOSIS — K5790 Diverticulosis of intestine, part unspecified, without perforation or abscess without bleeding: Secondary | ICD-10-CM | POA: Insufficient documentation

## 2022-03-12 DIAGNOSIS — K635 Polyp of colon: Secondary | ICD-10-CM | POA: Insufficient documentation

## 2023-07-19 ENCOUNTER — Emergency Department (HOSPITAL_COMMUNITY)
Admission: EM | Admit: 2023-07-19 | Discharge: 2023-07-19 | Disposition: A | Discharge status: Home / Self Care | Payer: Medicare Other | Attending: Emergency Medicine | Admitting: Emergency Medicine

## 2023-07-19 ENCOUNTER — Other Ambulatory Visit: Payer: Self-pay

## 2023-07-19 DIAGNOSIS — N341 Nonspecific urethritis: Secondary | ICD-10-CM | POA: Diagnosis not present

## 2023-07-19 DIAGNOSIS — I10 Essential (primary) hypertension: Secondary | ICD-10-CM | POA: Diagnosis not present

## 2023-07-19 DIAGNOSIS — R350 Frequency of micturition: Secondary | ICD-10-CM | POA: Diagnosis present

## 2023-07-19 DIAGNOSIS — N342 Other urethritis: Secondary | ICD-10-CM

## 2023-07-19 LAB — URINALYSIS, ROUTINE W REFLEX MICROSCOPIC
Bacteria, UA: NONE SEEN
Bilirubin Urine: NEGATIVE
Glucose, UA: NEGATIVE mg/dL
Ketones, ur: NEGATIVE mg/dL
Nitrite: NEGATIVE
Protein, ur: NEGATIVE mg/dL
Specific Gravity, Urine: 1.014 (ref 1.005–1.030)
pH: 5 (ref 5.0–8.0)

## 2023-07-19 LAB — BASIC METABOLIC PANEL
Anion gap: 9 (ref 5–15)
BUN: 19 mg/dL (ref 8–23)
CO2: 23 mmol/L (ref 22–32)
Calcium: 8.9 mg/dL (ref 8.9–10.3)
Chloride: 107 mmol/L (ref 98–111)
Creatinine, Ser: 1.08 mg/dL (ref 0.61–1.24)
GFR, Estimated: >60 mL/min (ref >60)
Glucose, Bld: 82 mg/dL (ref 70–99)
Potassium: 4.6 mmol/L (ref 3.5–5.1)
Sodium: 139 mmol/L (ref 135–145)

## 2023-07-19 LAB — HIV ANTIBODY (ROUTINE TESTING W REFLEX): HIV Screen 4th Generation wRfx: NONREACTIVE

## 2023-07-19 LAB — CBC
HCT: 40.8 % (ref 39.0–52.0)
Hemoglobin: 13.4 g/dL (ref 13.0–17.0)
MCH: 29.6 pg (ref 26.0–34.0)
MCHC: 32.8 g/dL (ref 30.0–36.0)
MCV: 90.3 fL (ref 80.0–100.0)
Platelets: 203 10*3/uL (ref 150–400)
RBC: 4.52 MIL/uL (ref 4.22–5.81)
RDW: 12.7 % (ref 11.5–15.5)
WBC: 5.3 10*3/uL (ref 4.0–10.5)
nRBC: 0 % (ref 0.0–0.2)

## 2023-07-19 MED ORDER — LIDOCAINE HCL (PF) 1 % IJ SOLN
1.0000 mL | Freq: Once | INTRAMUSCULAR | Status: AC
  Administered 2023-07-19: 2.1 mL
  Filled 2023-07-19: qty 30

## 2023-07-19 MED ORDER — CEFTRIAXONE SODIUM 1 G IJ SOLR
500.0000 mg | Freq: Once | INTRAMUSCULAR | Status: AC
  Administered 2023-07-19: 500 mg via INTRAMUSCULAR
  Filled 2023-07-19: qty 10

## 2023-07-19 MED ORDER — DOXYCYCLINE HYCLATE 100 MG PO TABS: 100.0000 mg | ORAL_TABLET | Freq: Two times a day (BID) | ORAL | 0 refills | Status: DC

## 2023-07-19 NOTE — ED Provider Notes (Signed)
[place] EMERGENCY DEPARTMENT AT [facility] Provider Note   CSN: [id number] Arrival date & time: 07/19/23  1308     History    Lance Walters is a 67 y.o. male.     Patient has history of epilepsy, anxiety, depression, reflux, hyperlipidemia, cocaine use.  Patient states he has been using crack cocaine.  He was recently in rehab for that.  While he was there he had laboratory tests performed.  Patient states his daughter is a Publishing rights manager.  She reviewed his labs and told him he has evidence of kidney disease.  His blood pressure was also been elevated.  Patient is scheduled to see a primary care doctor later this week.  Patient was concerned about his kidneys and elevated blood pressure.  Patient states he did use cocaine again yesterday but he is trying to stay clean.  Patient reportedly felt lightheaded yesterday he had some headaches he is also noted some frequency in urination and some flank pain.  Currently is not having any abdominal pain or flank pain.  No focal numbness or weakness  Home Medications Prior to Admission medications   Medication Sig Start Date End Date Taking? Authorizing Provider  doxycycline (VIBRA-TABS) 100 MG tablet Take 1 tablet (100 mg total) by mouth 2 (two) times daily. 07/19/23  Yes Linwood Dibbles, MD  acetaminophen (TYLENOL) 500 MG tablet Take 500 mg by mouth every 8 (eight) hours as needed for moderate pain.    [provider]  calcium-vitamin D (OSCAL WITH D) 500-200 MG-UNIT per tablet Take 1 tablet by mouth daily with breakfast.    [provider]  cyclobenzaprine (FLEXERIL) 5 MG tablet Take 1 tablet (5 mg total) by mouth 2 (two) times daily as needed for muscle spasms. 12/15/13   Teressa Lower, NP  gabapentin (NEURONTIN) 300 MG capsule Take 100 mg by mouth 3 (three) times daily.    [provider]  HydrOXYzine Pamoate (VISTARIL PO) Take 1 capsule by mouth at bedtime.    [provider]   levETIRAcetam (KEPPRA) 1000 MG tablet Take 1,000 mg by mouth daily.    [provider]  Meloxicam (MOBIC PO) Take 1 tablet by mouth at bedtime.    [provider]  sertraline (ZOLOFT) 50 MG tablet Take 150 mg by mouth daily.    [provider]  tamsulosin (FLOMAX) 0.4 MG CAPS capsule 0.4 mg. 09/18/20   [provider]      Allergies    Patient has no known allergies.    Review of Systems   Review of Systems  Genitourinary:  Positive for flank pain.    Physical Exam Updated Vital Signs BP (!) 139/91   Pulse 61   Temp 98 F (36.7 C) (Oral)   Resp 18   Ht 1.753 m (5\' 9" )   Wt 78.5 kg   SpO2 100%   BMI 25.56 kg/m  Physical Exam Vitals and nursing note reviewed.  Constitutional:      General: He is not in acute distress.    Appearance: He is well-developed.  HENT:     Head: Normocephalic and atraumatic.     Right Ear: External ear normal.     Left Ear: External ear normal.  Eyes:     General: No scleral icterus.       Right eye: No discharge.        Left eye: No discharge.     Conjunctiva/sclera: Conjunctivae normal.  Neck:     Trachea:  No tracheal deviation.  Cardiovascular:     Rate and Rhythm: Normal rate and regular rhythm.  Pulmonary:     Effort: Pulmonary effort is normal. No respiratory distress.     Breath sounds: Normal breath sounds. No stridor. No wheezing or rales.  Abdominal:     General: Bowel sounds are normal. There is no distension.     Palpations: Abdomen is soft.     Tenderness: There is no abdominal tenderness. There is no guarding or rebound.  Musculoskeletal:        General: No tenderness or deformity.     Cervical back: Neck supple.  Skin:    General: Skin is warm and dry.     Findings: No rash.  Neurological:     General: No focal deficit present.     Mental Status: He is alert.     Cranial Nerves: No cranial nerve deficit, dysarthria or facial asymmetry.     Sensory: No sensory deficit.     Motor:  No abnormal muscle tone or seizure activity.     Coordination: Coordination normal.  Psychiatric:        Mood and Affect: Mood normal.     ED Results / Procedures / Treatments   Labs (all labs ordered are listed, but only abnormal results are displayed) Labs Reviewed  URINALYSIS, ROUTINE W REFLEX MICROSCOPIC - Abnormal; Notable for the following components:      Result Value   Hgb urine dipstick SMALL (*)    Leukocytes,Ua LARGE (*)    All other components within normal limits  BASIC METABOLIC PANEL  CBC  URINALYSIS, W/ REFLEX TO CULTURE (INFECTION SUSPECTED)  HIV ANTIBODY (ROUTINE TESTING W REFLEX)  RPR  GC/CHLAMYDIA PROBE AMP ([place]) NOT AT [facility]    EKG EKG Interpretation Date/Time:  Tuesday July 19 2023 11:33:40 EDT Ventricular Rate:  53 PR Interval:  193 QRS Duration:  77 QT Interval:  474 QTC Calculation: 445 R Axis:   -66  Text Interpretation: Sinus rhythm Left anterior fascicular block Borderline T wave abnormalities Confirmed by Linwood Dibbles [phone]) on 07/19/2023 11:44:54 AM  Radiology No results found.  Procedures Procedures    Medications Ordered in ED Medications  cefTRIAXone (ROCEPHIN) injection 500 mg (has no administration in time range)  lidocaine (PF) (XYLOCAINE) 1 % injection 1-2.1 mL (has no administration in time range)    ED Course/ Medical Decision Making/ A&P Clinical Course as of 07/19/23 1355  Tue Jul 19, 2023  1343 CBC normal.  Metabolic panel normal.  Urinalysis does show white blood cells suggestive of possible infection [JK]    Clinical Course User Index [JK] Linwood Dibbles, MD                             Medical Decision Making Amount and/or Complexity of Data Reviewed Labs: ordered.  Risk Prescription drug management.   Patient's ED workup is reassuring.  He does not have any evidence of kidney dysfunction.  CBC and metabolic panel were normal.  Patient's blood pressure also decreased without intervention.  At the bedside  last blood pressure was below 120/80.  Patient's urinalysis does suggest possible infection.  Patient does admit to recent penile discharge.  He did have unprotected sex.  Will send off STI panel.  Will also send off urine culture.  Will discharge on prescription of doxycycline.  Patient given Rocephin in the ED.        Final Clinical Impression(s) /  ED Diagnoses Final diagnoses:  Urethritis    Rx / DC Orders ED Discharge Orders          Ordered    doxycycline (VIBRA-TABS) 100 MG tablet  2 times daily        07/19/23 1354              Linwood Dibbles, MD 07/19/23 1355

## 2023-07-19 NOTE — Discharge Instructions (Addendum)
Your blood test today were normal.  No concerns of any kidney dysfunction.  Take the antibiotics as prescribed to help for possible urine/actually transmitted infection.  With a primary care doctor to be rechecked.

## 2023-07-19 NOTE — ED Triage Notes (Signed)
Pt arrives from home via EMS for multiple complaints with bilateral flank pain R>L, dizziness,HTN, headache, increased frequency in urination. Recent drug rehab for crack cocaine, last use 2 days ago.

## 2023-07-20 LAB — GC/CHLAMYDIA PROBE AMP (~~LOC~~) NOT AT ARMC
Chlamydia: NEGATIVE
Comment: NEGATIVE
Comment: NORMAL
Neisseria Gonorrhea: NEGATIVE

## 2023-07-20 LAB — URINE CULTURE: Culture: NO GROWTH

## 2023-07-20 LAB — RPR: RPR Ser Ql: NONREACTIVE

## 2023-11-10 ENCOUNTER — Other Ambulatory Visit: Payer: Self-pay

## 2023-11-10 ENCOUNTER — Inpatient Hospital Stay (HOSPITAL_COMMUNITY)
Admission: EM | Admit: 2023-11-10 | Discharge: 2023-11-12 | DRG: 918 | Disposition: A | Discharge status: Home / Self Care | Payer: Medicare Other | Attending: Internal Medicine | Admitting: Internal Medicine

## 2023-11-10 ENCOUNTER — Emergency Department (HOSPITAL_COMMUNITY): Payer: Medicare Other

## 2023-11-10 ENCOUNTER — Encounter (HOSPITAL_COMMUNITY): Payer: Self-pay | Admitting: Emergency Medicine

## 2023-11-10 ENCOUNTER — Other Ambulatory Visit: Payer: Self-pay | Admitting: Physician Assistant

## 2023-11-10 DIAGNOSIS — Z8249 Family history of ischemic heart disease and other diseases of the circulatory system: Secondary | ICD-10-CM

## 2023-11-10 DIAGNOSIS — F17210 Nicotine dependence, cigarettes, uncomplicated: Secondary | ICD-10-CM

## 2023-11-10 DIAGNOSIS — E782 Mixed hyperlipidemia: Secondary | ICD-10-CM

## 2023-11-10 DIAGNOSIS — M1990 Unspecified osteoarthritis, unspecified site: Secondary | ICD-10-CM | POA: Diagnosis present

## 2023-11-10 DIAGNOSIS — Z79899 Other long term (current) drug therapy: Secondary | ICD-10-CM

## 2023-11-10 DIAGNOSIS — R072 Precordial pain: Secondary | ICD-10-CM | POA: Diagnosis not present

## 2023-11-10 DIAGNOSIS — F4310 Post-traumatic stress disorder, unspecified: Secondary | ICD-10-CM | POA: Diagnosis present

## 2023-11-10 DIAGNOSIS — R931 Abnormal findings on diagnostic imaging of heart and coronary circulation: Secondary | ICD-10-CM | POA: Diagnosis present

## 2023-11-10 DIAGNOSIS — F17200 Nicotine dependence, unspecified, uncomplicated: Secondary | ICD-10-CM

## 2023-11-10 DIAGNOSIS — R519 Headache, unspecified: Secondary | ICD-10-CM | POA: Diagnosis present

## 2023-11-10 DIAGNOSIS — T405X1A Poisoning by cocaine, accidental (unintentional), initial encounter: Principal | ICD-10-CM | POA: Diagnosis present

## 2023-11-10 DIAGNOSIS — R079 Chest pain, unspecified: Secondary | ICD-10-CM

## 2023-11-10 DIAGNOSIS — G40909 Epilepsy, unspecified, not intractable, without status epilepticus: Secondary | ICD-10-CM | POA: Diagnosis present

## 2023-11-10 DIAGNOSIS — M810 Age-related osteoporosis without current pathological fracture: Secondary | ICD-10-CM | POA: Diagnosis present

## 2023-11-10 DIAGNOSIS — F419 Anxiety disorder, unspecified: Secondary | ICD-10-CM | POA: Diagnosis present

## 2023-11-10 DIAGNOSIS — H18419 Arcus senilis, unspecified eye: Secondary | ICD-10-CM | POA: Diagnosis present

## 2023-11-10 DIAGNOSIS — E876 Hypokalemia: Secondary | ICD-10-CM | POA: Diagnosis present

## 2023-11-10 DIAGNOSIS — K219 Gastro-esophageal reflux disease without esophagitis: Secondary | ICD-10-CM | POA: Diagnosis present

## 2023-11-10 DIAGNOSIS — G8929 Other chronic pain: Secondary | ICD-10-CM | POA: Diagnosis present

## 2023-11-10 DIAGNOSIS — I10 Essential (primary) hypertension: Secondary | ICD-10-CM | POA: Diagnosis present

## 2023-11-10 DIAGNOSIS — I5189 Other ill-defined heart diseases: Secondary | ICD-10-CM

## 2023-11-10 DIAGNOSIS — F1490 Cocaine use, unspecified, uncomplicated: Secondary | ICD-10-CM | POA: Diagnosis present

## 2023-11-10 DIAGNOSIS — M549 Dorsalgia, unspecified: Secondary | ICD-10-CM | POA: Diagnosis present

## 2023-11-10 DIAGNOSIS — R0789 Other chest pain: Secondary | ICD-10-CM | POA: Diagnosis present

## 2023-11-10 DIAGNOSIS — F32A Depression, unspecified: Secondary | ICD-10-CM | POA: Diagnosis present

## 2023-11-10 DIAGNOSIS — Z791 Long term (current) use of non-steroidal anti-inflammatories (NSAID): Secondary | ICD-10-CM

## 2023-11-10 DIAGNOSIS — M542 Cervicalgia: Secondary | ICD-10-CM | POA: Diagnosis present

## 2023-11-10 DIAGNOSIS — F1410 Cocaine abuse, uncomplicated: Secondary | ICD-10-CM | POA: Diagnosis present

## 2023-11-10 LAB — MAGNESIUM: Magnesium: 2.1 mg/dL (ref 1.7–2.4)

## 2023-11-10 LAB — CBC WITH DIFFERENTIAL/PLATELET
Abs Immature Granulocytes: 0.03 10*3/uL (ref 0.00–0.07)
Basophils Absolute: 0.1 10*3/uL (ref 0.0–0.1)
Basophils Relative: 1 %
Eosinophils Absolute: 0.3 10*3/uL (ref 0.0–0.5)
Eosinophils Relative: 3 %
HCT: 42.5 % (ref 39.0–52.0)
Hemoglobin: 14.4 g/dL (ref 13.0–17.0)
Immature Granulocytes: 0 %
Lymphocytes Relative: 33 %
Lymphs Abs: 2.5 10*3/uL (ref 0.7–4.0)
MCH: 30.8 pg (ref 26.0–34.0)
MCHC: 33.9 g/dL (ref 30.0–36.0)
MCV: 90.8 fL (ref 80.0–100.0)
Monocytes Absolute: 0.6 10*3/uL (ref 0.1–1.0)
Monocytes Relative: 8 %
Neutro Abs: 4.2 10*3/uL (ref 1.7–7.7)
Neutrophils Relative %: 55 %
Platelets: 239 10*3/uL (ref 150–400)
RBC: 4.68 MIL/uL (ref 4.22–5.81)
RDW: 12.8 % (ref 11.5–15.5)
WBC: 7.6 10*3/uL (ref 4.0–10.5)
nRBC: 0 % (ref 0.0–0.2)

## 2023-11-10 LAB — COMPREHENSIVE METABOLIC PANEL
ALT: 17 U/L (ref 0–44)
AST: 21 U/L (ref 15–41)
Albumin: 4.1 g/dL (ref 3.5–5.0)
Alkaline Phosphatase: 70 U/L (ref 38–126)
Anion gap: 10 (ref 5–15)
BUN: 26 mg/dL — ABNORMAL HIGH (ref 8–23)
CO2: 23 mmol/L (ref 22–32)
Calcium: 9.3 mg/dL (ref 8.9–10.3)
Chloride: 105 mmol/L (ref 98–111)
Creatinine, Ser: 1.08 mg/dL (ref 0.61–1.24)
GFR, Estimated: >60 mL/min (ref >60)
Glucose, Bld: 150 mg/dL — ABNORMAL HIGH (ref 70–99)
Potassium: 3.2 mmol/L — ABNORMAL LOW (ref 3.5–5.1)
Sodium: 138 mmol/L (ref 135–145)
Total Bilirubin: 0.9 mg/dL (ref <1.2)
Total Protein: 7.7 g/dL (ref 6.5–8.1)

## 2023-11-10 LAB — ECHOCARDIOGRAM COMPLETE
AR max vel: 3.71 cm2
AV Peak grad: 3.9 mm[Hg]
Ao pk vel: 0.99 m/s
Area-P 1/2: 2.54 cm2
Height: 69 in
S' Lateral: 2.3 cm
Weight: 2768 [oz_av]

## 2023-11-10 LAB — TROPONIN I (HIGH SENSITIVITY)
Troponin I (High Sensitivity): 7 ng/L (ref <18)
Troponin I (High Sensitivity): 5 ng/L (ref <18)

## 2023-11-10 LAB — PHOSPHORUS: Phosphorus: 3.7 mg/dL (ref 2.5–4.6)

## 2023-11-10 LAB — LIPASE, BLOOD: Lipase: 42 U/L (ref 11–51)

## 2023-11-10 MED ORDER — ZIPRASIDONE MESYLATE 20 MG IM SOLR: 10.0000 mg | Freq: Once | INTRAMUSCULAR | Status: DC

## 2023-11-10 MED ORDER — NICOTINE 14 MG/24HR TD PT24
14.0000 mg | MEDICATED_PATCH | Freq: Every day | TRANSDERMAL | Status: DC
  Administered 2023-11-10 – 2023-11-12 (×3): 14 mg via TRANSDERMAL
  Filled 2023-11-10 (×3): qty 1

## 2023-11-10 MED ORDER — CYCLOBENZAPRINE HCL 5 MG PO TABS
5.0000 mg | ORAL_TABLET | Freq: Two times a day (BID) | ORAL | Status: DC | PRN
  Administered 2023-11-12: 5 mg via ORAL
  Filled 2023-11-10: qty 1

## 2023-11-10 MED ORDER — GABAPENTIN 100 MG PO CAPS
100.0000 mg | ORAL_CAPSULE | Freq: Three times a day (TID) | ORAL | Status: DC
  Administered 2023-11-10 – 2023-11-12 (×6): 100 mg via ORAL
  Filled 2023-11-10 (×6): qty 1

## 2023-11-10 MED ORDER — LEVETIRACETAM 500 MG PO TABS
500.0000 mg | ORAL_TABLET | Freq: Two times a day (BID) | ORAL | Status: DC
  Administered 2023-11-10 – 2023-11-12 (×4): 500 mg via ORAL
  Filled 2023-11-10 (×4): qty 1

## 2023-11-10 MED ORDER — ONDANSETRON HCL 4 MG/2ML IJ SOLN: 4.0000 mg | Freq: Four times a day (QID) | INTRAMUSCULAR | Status: DC | PRN

## 2023-11-10 MED ORDER — INFLUENZA VAC A&B SURF ANT ADJ 0.5 ML IM SUSY: 0.5000 mL | PREFILLED_SYRINGE | INTRAMUSCULAR | Status: DC | PRN

## 2023-11-10 MED ORDER — MIDAZOLAM HCL 2 MG/2ML IJ SOLN: 2.0000 mg | Freq: Once | INTRAMUSCULAR | Status: DC

## 2023-11-10 MED ORDER — ORAL CARE MOUTH RINSE: 15.0000 mL | OROMUCOSAL | Status: DC | PRN

## 2023-11-10 MED ORDER — TAMSULOSIN HCL 0.4 MG PO CAPS
0.4000 mg | ORAL_CAPSULE | Freq: Every day | ORAL | Status: DC
  Administered 2023-11-11 – 2023-11-12 (×2): 0.4 mg via ORAL
  Filled 2023-11-10 (×2): qty 1

## 2023-11-10 MED ORDER — POTASSIUM CHLORIDE CRYS ER 20 MEQ PO TBCR
40.0000 meq | EXTENDED_RELEASE_TABLET | Freq: Once | ORAL | Status: AC
  Administered 2023-11-10: 40 meq via ORAL
  Filled 2023-11-10: qty 2

## 2023-11-10 MED ORDER — ENOXAPARIN SODIUM 40 MG/0.4ML IJ SOSY
40.0000 mg | PREFILLED_SYRINGE | INTRAMUSCULAR | Status: DC
  Administered 2023-11-10 – 2023-11-11 (×2): 40 mg via SUBCUTANEOUS
  Filled 2023-11-10 (×2): qty 0.4

## 2023-11-10 MED ORDER — ONDANSETRON HCL 4 MG PO TABS: 4.0000 mg | ORAL_TABLET | Freq: Four times a day (QID) | ORAL | Status: DC | PRN

## 2023-11-10 MED ORDER — FLUOXETINE HCL 20 MG PO CAPS
20.0000 mg | ORAL_CAPSULE | Freq: Every day | ORAL | Status: DC
  Administered 2023-11-10 – 2023-11-11 (×2): 20 mg via ORAL
  Filled 2023-11-10 (×2): qty 1

## 2023-11-10 MED ORDER — ACETAMINOPHEN 650 MG RE SUPP: 650.0000 mg | Freq: Four times a day (QID) | RECTAL | Status: DC | PRN

## 2023-11-10 MED ORDER — PNEUMOCOCCAL 20-VAL CONJ VACC 0.5 ML IM SUSY: 0.5000 mL | PREFILLED_SYRINGE | INTRAMUSCULAR | Status: DC | PRN

## 2023-11-10 MED ORDER — ACETAMINOPHEN 325 MG PO TABS
650.0000 mg | ORAL_TABLET | Freq: Four times a day (QID) | ORAL | Status: DC | PRN
  Administered 2023-11-10: 650 mg via ORAL
  Filled 2023-11-10: qty 2

## 2023-11-10 NOTE — Progress Notes (Signed)
Echocardiogram 2D Echocardiogram has been performed.  Lance Walters 11/10/2023, 1:16 PM

## 2023-11-10 NOTE — ED Provider Notes (Signed)
[place] EMERGENCY DEPARTMENT AT [facility] Provider Note   CSN: [id number] Arrival date & time: 11/10/23  1006     History Chief Complaint  Patient presents with   Chest Pain    HPI Lance Walters is a 67 y.o. male presenting for chief complaint of chest pain.  67 year old male minimal medical history.  States that he has been having 2 months of chest pain intermittently.  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  He was told when the results were called and that he needed to come to the emergency department for further care and management as it was causing a "obstruction".  He denies any other symptoms at this time he denies fevers chills nausea vomiting syncope shortness of breath.   Patient's recorded medical, surgical, social, medication list and allergies were reviewed in the Snapshot window as part of the initial history.   Review of Systems   Review of Systems  Constitutional:  Negative for chills and fever.  HENT:  Negative for ear pain and sore throat.   Eyes:  Negative for pain and visual disturbance.  Respiratory:  Positive for chest tightness. Negative for cough and shortness of breath.   Cardiovascular:  Positive for chest pain. Negative for palpitations.  Gastrointestinal:  Negative for abdominal pain and vomiting.  Genitourinary:  Negative for dysuria and hematuria.  Musculoskeletal:  Negative for arthralgias and back pain.  Skin:  Negative for color change and rash.  Neurological:  Negative for seizures and syncope.  All other systems reviewed and are negative.   Physical Exam Updated Vital Signs BP (!) 141/104 (BP Location: Right Arm)   Pulse (!) 55   Temp 97.9 F (36.6 C) (Oral)   Resp 16   Ht 5\' 9"  (1.753 m)   Wt 78.5 kg   SpO2 99%   BMI 25.55 kg/m  Physical Exam Vitals and nursing note reviewed.  Constitutional:      General:  He is not in acute distress.    Appearance: He is well-developed.  HENT:     Head: Normocephalic and atraumatic.  Eyes:     Conjunctiva/sclera: Conjunctivae normal.  Cardiovascular:     Rate and Rhythm: Normal rate and regular rhythm.  Pulmonary:     Effort: Pulmonary effort is normal. No respiratory distress.  Abdominal:     General: Abdomen is flat. There is no distension.  Musculoskeletal:        General: No swelling or deformity.  Skin:    General: Skin is warm and dry.     Capillary Refill: Capillary refill takes less than 2 seconds.  Neurological:     Mental Status: He is alert and oriented to person, place, and time. Mental status is at baseline.      ED Course/ Medical Decision Making/ A&P    Procedures Procedures   Medications Ordered in ED Medications  potassium chloride SA (KLOR-CON M) CR tablet 40 mEq (has no administration in time range)    Medical Decision Making:    Lance Walters is a 66 y.o. male who presented to the ED today with abnormal outpatient imaging detailed above.     Complete initial physical exam performed, notably the patient  was hemodynamically stable no acute distress.      Reviewed and confirmed nursing documentation for past medical history, family history, social history.    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  No answer on the clinics phone number.  Will order a fresh echocardiogram for further identification what this may be.  Will consult cardiology in the interim and perform a EKG and troponin to evaluate for alternative etiologies of his chest pain.  Will perform a chest x-ray to look for pneumothorax or pneumonia or other more common etiologies of chest pain pending the above workup. This is most consistent with an acute life/limb threatening illness complicated by underlying chronic conditions.  Initial Study Results:    Laboratory  All laboratory results reviewed without evidence of clinically relevant pathology.    EKG EKG was reviewed independently. Rate, rhythm, axis, intervals all examined and without medically relevant abnormality. ST segments without concerns for elevations.    Radiology  All images reviewed independently. Agree with radiology report at this time.   DG Chest Portable 1 View  Result Date: 11/10/2023 CLINICAL DATA:  Chest pain. EXAM: PORTABLE CHEST 1 VIEW COMPARISON:  11/07/2013. FINDINGS: Low lung volume. Bilateral lung fields are clear. Bilateral costophrenic angles are clear. Normal cardio-mediastinal silhouette. No acute osseous abnormalities. The soft tissues are within normal limits. IMPRESSION: *No active disease. Electronically Signed   By: Jules Schick M.D.   On: 11/10/2023 14:26     Reassessment and Plan:   On reassessment patient in no acute distress.  Consulted cardiology who recommended admission to medicine for TEE in a.m. tomorrow.  N.p.o. at midnight. No acute indication for further intervention.  Clinical Impression:  1. Chest pain, unspecified type      Admit   Final Clinical Impression(s) / ED Diagnoses Final diagnoses:  Chest pain, unspecified type    Rx / DC Orders ED Discharge Orders     None         Glyn Ade, MD 11/10/23 1506

## 2023-11-10 NOTE — Plan of Care (Signed)

## 2023-11-10 NOTE — Consult Note (Addendum)
Cardiology Consultation   Patient ID: Lance Walters MRN: [id number]; DOB: 21-Mar-1956  Admit date: 11/10/2023 Date of Consult: 11/10/2023  PCP:  Lance Cai, NP   [facility] Providers Cardiologist:  New to [facility]     Patient Profile:   Lance Walters is a 67 y.o. male with a hx of  PTSD, crack cocaine abuse (smoke, not injection), tobacco abuse, history of seizure, hypertension and hyperlipidemia who is being seen 11/10/2023 for the evaluation of an abnormal echo done at PCP office at the request of Dr. Doran Durand.   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  Lance Walters does not have prior cardiac history.  Both of his parents passed away from heart issue.  Mother also has longstanding history of diabetes.  Lance Walters says in the past 6 months, Lance Walters has been having intermittent left-sided chest discomfort.  Symptom would last only a few seconds at a time before going away.  Chest discomfort has not been increasing in frequency and duration.  Discomfort usually less than a minute.  Chest pain can occur when Lance Walters bends over, twist his body, with activity, after Lance Walters uses cocaine and at rest.  Lance Walters has been enrolled in rehab.  Previously, Lance Walters was daily crack cocaine user.  Lance Walters has decreased his cocaine use to once every 2 to 3 days. Last cocaine use yesterday.  Despite prior history of hypertension and hyperlipidemia, Lance Walters is not on a lot of medication.  The only blood pressure medication Lance Walters is on is Cardura.  Lance Walters is not taking any cholesterol medication.  Lance Walters saw his PCP a month ago and Lance Walters was asked to have an echo.  Lance Walters was contacted yesterday due to finding of possible large left atrial mass seen on the echocardiogram.  Outside report is not available to me.  I have called his PCPs office and Lance Walters requested to  have the record faxed to [facility].  Lance Walters was instructed to come to [facility] emergency room for further evaluation.  Initial blood work showed sodium 138, potassium 3.2, troponin 7.  CBC normal.  EKG showed normal sinus rhythm, LVH with T wave inversion in the lateral leads.  Stat echocardiogram was performed in the ED, result currently pending.  Chest x-ray performed, result pending, on preliminary review, x-ray shows clear lung.    Past Medical History:  Diagnosis Date   Anxiety    Arthritis    Chronic neck and back pain    Depression    Epilepsy (HCC)    Frequent headaches    GERD (gastroesophageal reflux disease)    HTN (hypertension)    Hyperlipidemia    diet controlled, no medication   Osteoporosis    PTSD (post-traumatic stress disorder)    Seizures (HCC)    last one 2015, controlled with med    Substance abuse (HCC)    history cocaine use    Past Surgical History:  Procedure Laterality Date   no prior surgery       Home Medications:  Prior to Admission medications   Medication Sig Start Date End Date Taking? Authorizing Provider  acetaminophen (TYLENOL) 500 MG tablet Take 500 mg by mouth every 8 (eight) hours as needed for moderate pain.    [provider]  calcium-vitamin D (OSCAL WITH D) 500-200 MG-UNIT per tablet Take 1  tablet by mouth daily with breakfast.    [provider]  cyclobenzaprine (FLEXERIL) 5 MG tablet Take 1 tablet (5 mg total) by mouth 2 (two) times daily as needed for muscle spasms. 12/15/13   Teressa Lower, NP  doxycycline (VIBRA-TABS) 100 MG tablet Take 1 tablet (100 mg total) by mouth 2 (two) times daily. 07/19/23   Linwood Dibbles, MD  gabapentin (NEURONTIN) 300 MG capsule Take 100 mg by mouth 3 (three) times daily.    [provider]  HydrOXYzine Pamoate (VISTARIL PO) Take 1 capsule by mouth at bedtime.    [provider]  levETIRAcetam (KEPPRA) 1000 MG tablet Take 1,000 mg by mouth daily.    [provider]  Meloxicam (MOBIC PO) Take 1 tablet by mouth at bedtime.    [provider]  sertraline (ZOLOFT) 50 MG tablet Take 150 mg by mouth daily.    [provider]  tamsulosin (FLOMAX) 0.4 MG CAPS capsule 0.4 mg. 09/18/20   [provider]    Inpatient Medications: Scheduled Meds:  Continuous Infusions:  PRN Meds:   Allergies:   No Known Allergies  Social History:   Social History   Socioeconomic History   Marital status: Single    Spouse name: Not on file   Number of children: Not on file   Years of education: Not on file   Highest education level: Not on file  Occupational History   Not on file  Tobacco Use   Smoking status: Every Day    Current packs/day: 1.50    Average packs/day: 1.5 packs/day for 20.0 years (30.0 ttl pk-yrs)    Types: Cigarettes   Smokeless tobacco: Never  Substance and Sexual Activity   Alcohol use: Yes    Alcohol/week: 2.0 standard drinks of alcohol    Types: 2 Cans of beer per week    Comment: occasional   Drug use: Yes    Types: Cocaine   Sexual activity: Not on file  Other Topics Concern   Not on file  Social History Narrative   Not on file   Social Determinants of Health   Financial Resource Strain: Not on file  Food Insecurity: Not on file  Transportation Needs: Not on file  Physical Activity: Not on file  Stress: Not on file  Social Connections: Unknown (05/11/2022)   Received from [facility], [facility]   Social Network    Social Network: Not on file  Intimate Partner Violence: Unknown (04/02/2022)   Received from [employer], [facility]   HITS    Physically Hurt: Not on file    Insult or Talk Down To: Not on file    Threaten Physical Harm: Not on file    Scream or Curse: Not on file    Family History:    Family History  Problem Relation Age of Onset   Hypertension Mother    Diabetes Mother    Heart failure Mother    Heart disease Mother    Heart attack Father     Hypertension Father    Heart failure Father    Prostate cancer Father    Dementia Sister    Eczema Daughter    Lupus Daughter    Asthma Son    Asthma Son    Hypertension Son    Colon cancer Neg Hx    Colon polyps Neg Hx    Esophageal cancer Neg Hx    Rectal cancer Neg Hx    Stomach cancer Neg Hx  ROS:  Please see the history of present illness.   All other ROS reviewed and negative.     Physical Exam/Data:   Vitals:   11/10/23 1017 11/10/23 1018 11/10/23 1346 11/10/23 1416  BP: (!) 165/103  (!) 141/104   Pulse: 83  (!) 55   Resp: 15  16   Temp: 98.2 F (36.8 C)   97.9 F (36.6 C)  TempSrc: Oral   Oral  SpO2: 99%  99%   Weight:  78.5 kg    Height:  5\' 9"  (1.753 m)     No intake or output data in the 24 hours ending 11/10/23 1437    11/10/2023   10:18 AM 07/19/2023   10:04 AM 02/16/2022    2:48 PM  Last 3 Weights  Weight (lbs) 173 lb 173 lb 1 oz 173 lb  Weight (kg) 78.472 kg 78.5 kg 78.472 kg     Body mass index is 25.55 kg/m.  General:  Well nourished, well developed, in no acute distress HEENT: normal Neck: no JVD Vascular: No carotid bruits; Distal pulses 2+ bilaterally Cardiac:  normal S1, S2; RRR; no murmur  Lungs:  clear to auscultation bilaterally, no wheezing, rhonchi or rales  Abd: soft, nontender, no hepatomegaly  Ext: no edema Musculoskeletal:  No deformities, BUE and BLE strength normal and equal Skin: warm and dry  Neuro:  CNs 2-12 intact, no focal abnormalities noted Psych:  Normal affect   EKG:  The EKG was personally reviewed and demonstrates:   NSR with LVH and TWI in lateral leads  Telemetry:  Telemetry was personally reviewed and demonstrates:  NSR without significant ventricular ectopy   Relevant CV Studies:  N/A  Laboratory Data:  High Sensitivity Troponin:   Recent Labs  Lab 11/10/23 1113  TROPONINIHS 7     Chemistry Recent Labs  Lab 11/10/23 1113  NA 138  K 3.2*  CL 105  CO2 23  GLUCOSE 150*  BUN 26*   CREATININE 1.08  CALCIUM 9.3  GFRNONAA >60  ANIONGAP 10    Recent Labs  Lab 11/10/23 1113  PROT 7.7  ALBUMIN 4.1  AST 21  ALT 17  ALKPHOS 70  BILITOT 0.9   Lipids No results for input(s): "CHOL", "TRIG", "HDL", "LABVLDL", "LDLCALC", "CHOLHDL" in the last 168 hours.  Hematology Recent Labs  Lab 11/10/23 1113  WBC 7.6  RBC 4.68  HGB 14.4  HCT 42.5  MCV 90.8  MCH 30.8  MCHC 33.9  RDW 12.8  PLT 239   Thyroid No results for input(s): "TSH", "FREET4" in the last 168 hours.  BNPNo results for input(s): "BNP", "PROBNP" in the last 168 hours.  DDimer No results for input(s): "DDIMER" in the last 168 hours.   Radiology/Studies:  DG Chest Portable 1 View  Result Date: 11/10/2023 CLINICAL DATA:  Chest pain. EXAM: PORTABLE CHEST 1 VIEW COMPARISON:  11/07/2013. FINDINGS: Low lung volume. Bilateral lung fields are clear. Bilateral costophrenic angles are clear. Normal cardio-mediastinal silhouette. No acute osseous abnormalities. The soft tissues are within normal limits. IMPRESSION: *No active disease. Electronically Signed   By: Jules Schick M.D.   On: 11/10/2023 14:26     Assessment and Plan:   Abnormal echocardiogram             - Patient was sent to the hospital for "mass in the heart". Based on outside report, EF 55-60%, moderate LVH, mild MR, mild mitral valve thickening, long echo linear mobile mass seen in the right atrium, due  to limited visualization unstable to further characterize the finding or assess its size. Differential diagnosis include but not limited to prominent eustachian valve or thrombus.              - TTE repeat in ED, pending final report.  - Raw image reviewed by Dr. Odis Hollingshead who confirm no left or right atrial mass.  - no the parasternal short axis at the AV at 11 or 12 o'clock position there is echo lucency which is likely part of the TV annulus but given his use of crack/cocaine, poor dentition shared decision was to proceed w/ transesophageal  echocardiogram.   Atypical chest pain  - atypical symptom, only lasting few seconds at a time, occurs with cocaine use, body rotation, deep inspiration, bending over and exertion. Not good candidate for coronary CT given cocaine use and inability to take BB. Will trend trop, if second one remain negative, plan for outpatient myoview.   Crack cocaine abuse: last use yesterday  Tobacco abuse: daily smoker  H/o seizure  Hypertension: BP elevated, only on Cardura at home. SBP 50-60s, will avoid BB, consider add amlodipine 5mg  daily.    Hyperlipidemia: not on any statin medication. FLP tomorrow AM.    Risk Assessment/Risk Scores:     For questions or updates, please contact [facility] Please consult [email or web address] for contact info under    Signed, Azalee Course, [place]  11/10/2023 2:37 PM  ADDENDUM:   Patient seen and examined with APP, Azalee Course PA.  I personally taken a history, examined the patient, reviewed relevant notes,  laboratory data / imaging studies.  I performed a substantive portion of this encounter and formulated the important aspects of the plan.  I agree with the APP's note, impression, and recommendations; however, I have edited the note to reflect changes or salient points.   Patient has been experiencing noncardiac chest pain which she describes as soreness over the anterior chest wall, reproducible with palpation, intermittent, not brought on by effort related activities, does not resolve with rest, and self-limited.  At his last visit with PCP and echocardiogram was performed and Lance Walters was called to go to the ED due to concerns of atrial mass.  Lance Walters denies heart failure symptoms.   PHYSICAL EXAM: Today's Vitals   11/10/23 1919 11/10/23 1925 11/10/23 2143 11/10/23 2304  BP: (!) 145/111   115/82  Pulse: 67   80  Resp: 19   20  Temp: 98.4 F (36.9 C)   98 F (36.7 C)  TempSrc: Oral   Oral  SpO2: 100%   99%  Weight:      Height:      PainSc:  0-No pain  0-No pain    Body mass index is 25.55 kg/m.   Net IO Since Admission: No IO data has been entered for this period [11/10/23 2341]  Filed Weights   11/10/23 1018  Weight: 78.5 kg    Physical Exam  Constitutional: No distress. Lance Walters appears chronically ill.  hemodynamically stable  HENT:  Arcus senilis  Neck: No JVD present.  Cardiovascular: Normal rate, regular rhythm, S1 normal and S2 normal. Exam reveals no gallop, no S3 and no S4.  No murmur heard. Pulmonary/Chest: Effort normal and breath sounds normal. No stridor. Lance Walters has no wheezes. Lance Walters has no rales.  Abdominal: Soft. Bowel sounds are normal. Lance Walters exhibits no distension. There is no abdominal tenderness.  Musculoskeletal:        General: No edema.     Cervical  back: Neck supple.     Comments: Third digit on left hand at approximately the PIP joint Lance Walters has an open wound with drainage.  Neurological: Lance Walters is alert and oriented to person, place, and time. Lance Walters has intact cranial nerves (2-12).  Skin: Skin is warm.   EKG: (personally reviewed by me) 11/10/2023: Sinus rhythm, 79 bpm, consider lateral ischemia, without underlying injury pattern.  Telemetry: (personally reviewed by me) Sinus rhythm   Impression:  Abnormal echo Precordial pain. Chronic cocaine use. Hypertension. Hyperlipidemia. History of COVID-19 infection  Recommendations:  Patient was sent to the hospital due to an outside echo noting concern for left atrial mass.  Since those images were not available for review another echocardiogram was performed while Lance Walters was in the ED to rule out acute pathology.  Patient does not have mass in either left or right atrium.  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 In the parasternal short axis at the aortic valve level there appears to be echolucency around 11 to 12 o'clock position either secondary to tricuspid valve vs. Pathology.  Given the concern on the current study, known history of crack and cocaine use,  and acute drainage from the PIP joint. Lance Walters was scheduled for TEE 11/11/2023.   However, one of my partner Dr. Eden Emms also reviewed the current TTE images it was felt that the echo lucency is likely part of the TV annulus/valve rater than true pathology.   Will hold off TEE for now.   Recommend blood cultures and monitor for fevers. If either are positive than TEE is reasonable.   With regards to chest pain it is non-cardiac. Echo noted no RWMA and LVEF is preserved, EKG dose not show STEMI, no arrhythmia,  hs troponin are w/n normal limits.  Recommended complete cessation of crack cocaine.  His symptoms continue outpatient exercise nuclear stress test could be considered.  Sooner if change in clinical status  Further recommendations to follow as the case evolves.   This note was created using a voice recognition software as a result there may be grammatical errors inadvertently enclosed that do not reflect the nature of this encounter. Every attempt is made to correct such errors.   Tessa Lerner, DO, [facility]  [address] [place], [place] [address] Pager: [phone] Office: [phone] 11/10/2023 11:41 PM

## 2023-11-10 NOTE — Consult Note (Incomplete)
Cardiology Consultation   Patient ID: Lance Walters MRN: [id number]; DOB: 03-22-1956  Admit date: 11/10/2023 Date of Consult: 11/10/2023  PCP:  Karenann Cai, NP   [facility] Providers Cardiologist:  New to [facility]     Patient Profile:   Lance Walters is a 67 y.o. male with a hx of  PTSD, crack cocaine abuse (smoke, not injection), tobacco abuse, history of seizure, hypertension and hyperlipidemia who is being seen 11/10/2023 for the evaluation of an abnormal echo done at PCP office at the request of Dr. Doran Durand.   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  He does not have prior cardiac history.  Both of his parents passed away from heart issue.  Mother also has longstanding history of diabetes.  He says in the past 6 months, he has been having intermittent left-sided chest discomfort.  Symptom would last only a few seconds at a time before going away.  Chest discomfort has not been increasing in frequency and duration.  Discomfort usually less than a minute.  Chest pain can occur when he bends over, twist his body, with activity, after he uses cocaine and at rest.  He has been enrolled in rehab.  Previously, he was daily crack cocaine user.  He has decreased his cocaine use to once every 2 to 3 days. Last cocaine use yesterday.  Despite prior history of hypertension and hyperlipidemia, he is not on a lot of medication.  The only blood pressure medication he is on is Cardura.  He is not taking any cholesterol medication.  He saw his PCP a month ago and he was asked to have an echo.  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  Outside report is not available to me.  I have called his PCPs office and he requested to  have the record faxed to [facility].  He was instructed to come to [facility] emergency room for further evaluation.  Initial blood work showed sodium 138, potassium 3.2, troponin 7.  CBC normal.  EKG showed normal sinus rhythm, LVH with T wave inversion in the lateral leads.  Stat echocardiogram was performed in the ED, result currently pending.  Chest x-ray performed, result pending, on preliminary review, x-ray shows clear lung.    Past Medical History:  Diagnosis Date  . Anxiety   . Arthritis   . Chronic neck and back pain   . Depression   . Epilepsy (HCC)   . Frequent headaches   . GERD (gastroesophageal reflux disease)   . HTN (hypertension)   . Hyperlipidemia    diet controlled, no medication  . Osteoporosis   . PTSD (post-traumatic stress disorder)   . Seizures (HCC)    last one 2015, controlled with med   . Substance abuse (HCC)    history cocaine use    Past Surgical History:  Procedure Laterality Date  . no prior surgery       Home Medications:  Prior to Admission medications   Medication Sig Start Date End Date Taking? Authorizing Provider  acetaminophen (TYLENOL) 500 MG tablet Take 500 mg by mouth every 8 (eight) hours as needed for moderate pain.    [provider]  calcium-vitamin D (OSCAL WITH D) 500-200 MG-UNIT per tablet Take 1  tablet by mouth daily with breakfast.    [provider]  cyclobenzaprine (FLEXERIL) 5 MG tablet Take 1 tablet (5 mg total) by mouth 2 (two) times daily as needed for muscle spasms. 12/15/13   Teressa Lower, NP  doxycycline (VIBRA-TABS) 100 MG tablet Take 1 tablet (100 mg total) by mouth 2 (two) times daily. 07/19/23   Linwood Dibbles, MD  gabapentin (NEURONTIN) 300 MG capsule Take 100 mg by mouth 3 (three) times daily.    [provider]  HydrOXYzine Pamoate (VISTARIL PO) Take 1 capsule by mouth at bedtime.    [provider]  levETIRAcetam (KEPPRA) 1000 MG tablet Take 1,000 mg by mouth daily.     [provider]  Meloxicam (MOBIC PO) Take 1 tablet by mouth at bedtime.    [provider]  sertraline (ZOLOFT) 50 MG tablet Take 150 mg by mouth daily.    [provider]  tamsulosin (FLOMAX) 0.4 MG CAPS capsule 0.4 mg. 09/18/20   [provider]    Inpatient Medications: Scheduled Meds:  Continuous Infusions:  PRN Meds:   Allergies:   No Known Allergies  Social History:   Social History   Socioeconomic History  . Marital status: Single    Spouse name: Not on file  . Number of children: Not on file  . Years of education: Not on file  . Highest education level: Not on file  Occupational History  . Not on file  Tobacco Use  . Smoking status: Every Day    Current packs/day: 1.50    Average packs/day: 1.5 packs/day for 20.0 years (30.0 ttl pk-yrs)    Types: Cigarettes  . Smokeless tobacco: Never  Substance and Sexual Activity  . Alcohol use: Yes    Alcohol/week: 2.0 standard drinks of alcohol    Types: 2 Cans of beer per week    Comment: occasional  . Drug use: Yes    Types: Cocaine  . Sexual activity: Not on file  Other Topics Concern  . Not on file  Social History Narrative  . Not on file   Social Determinants of Health   Financial Resource Strain: Not on file  Food Insecurity: Not on file  Transportation Needs: Not on file  Physical Activity: Not on file  Stress: Not on file  Social Connections: Unknown (05/11/2022)   Received from [facility], [facility]   Social Network   . Social Network: Not on file  Intimate Partner Violence: Unknown (04/02/2022)   Received from [facility], [facility]   HITS   . Physically Hurt: Not on file   . Insult or Talk Down To: Not on file   . Threaten Physical Harm: Not on file   . Scream or Curse: Not on file    Family History:    Family History  Problem Relation Age of Onset  . Hypertension Mother   . Diabetes Mother   . Heart failure Mother   . Heart disease Mother    . Heart attack Father   . Hypertension Father   . Heart failure Father   . Prostate cancer Father   . Dementia Sister   . Eczema Daughter   . Lupus Daughter   . Asthma Son   . Asthma Son   . Hypertension Son   . Colon cancer Neg Hx   . Colon polyps Neg Hx   . Esophageal cancer Neg Hx   . Rectal cancer Neg Hx   . Stomach cancer Neg Hx  ROS:  Please see the history of present illness.   All other ROS reviewed and negative.     Physical Exam/Data:   Vitals:   11/10/23 1017 11/10/23 1018 11/10/23 1346 11/10/23 1416  BP: (!) 165/103  (!) 141/104   Pulse: 83  (!) 55   Resp: 15  16   Temp: 98.2 F (36.8 C)   97.9 F (36.6 C)  TempSrc: Oral   Oral  SpO2: 99%  99%   Weight:  78.5 kg    Height:  5\' 9"  (1.753 m)     No intake or output data in the 24 hours ending 11/10/23 1437    11/10/2023   10:18 AM 07/19/2023   10:04 AM 02/16/2022    2:48 PM  Last 3 Weights  Weight (lbs) 173 lb 173 lb 1 oz 173 lb  Weight (kg) 78.472 kg 78.5 kg 78.472 kg     Body mass index is 25.55 kg/m.  General:  Well nourished, well developed, in no acute distress HEENT: normal Neck: no JVD Vascular: No carotid bruits; Distal pulses 2+ bilaterally Cardiac:  normal S1, S2; RRR; no murmur  Lungs:  clear to auscultation bilaterally, no wheezing, rhonchi or rales  Abd: soft, nontender, no hepatomegaly  Ext: no edema Musculoskeletal:  No deformities, BUE and BLE strength normal and equal Skin: warm and dry  Neuro:  CNs 2-12 intact, no focal abnormalities noted Psych:  Normal affect   EKG:  The EKG was personally reviewed and demonstrates:   NSR with LVH and TWI in lateral leads  Telemetry:  Telemetry was personally reviewed and demonstrates:  NSR without significant ventricular ectopy   Relevant CV Studies:  N/A  Laboratory Data:  High Sensitivity Troponin:   Recent Labs  Lab 11/10/23 1113  TROPONINIHS 7     Chemistry Recent Labs  Lab 11/10/23 1113  NA 138  K 3.2*  CL 105   CO2 23  GLUCOSE 150*  BUN 26*  CREATININE 1.08  CALCIUM 9.3  GFRNONAA >60  ANIONGAP 10    Recent Labs  Lab 11/10/23 1113  PROT 7.7  ALBUMIN 4.1  AST 21  ALT 17  ALKPHOS 70  BILITOT 0.9   Lipids No results for input(s): "CHOL", "TRIG", "HDL", "LABVLDL", "LDLCALC", "CHOLHDL" in the last 168 hours.  Hematology Recent Labs  Lab 11/10/23 1113  WBC 7.6  RBC 4.68  HGB 14.4  HCT 42.5  MCV 90.8  MCH 30.8  MCHC 33.9  RDW 12.8  PLT 239   Thyroid No results for input(s): "TSH", "FREET4" in the last 168 hours.  BNPNo results for input(s): "BNP", "PROBNP" in the last 168 hours.  DDimer No results for input(s): "DDIMER" in the last 168 hours.   Radiology/Studies:  DG Chest Portable 1 View  Result Date: 11/10/2023 CLINICAL DATA:  Chest pain. EXAM: PORTABLE CHEST 1 VIEW COMPARISON:  11/07/2013. FINDINGS: Low lung volume. Bilateral lung fields are clear. Bilateral costophrenic angles are clear. Normal cardio-mediastinal silhouette. No acute osseous abnormalities. The soft tissues are within normal limits. IMPRESSION: *No active disease. Electronically Signed   By: Jules Schick M.D.   On: 11/10/2023 14:26     Assessment and Plan:   Abnormal echocardiogram             - Patient was sent to the hospital for "mass in the heart". Based on outside report, EF 55-60%, moderate LVH, mild MR, mild mitral valve thickening, long echo linear mobile mass seen in the right atrium, due  to limited visualization unstable to further characterize the finding or assess its size. Differential diagnosis include but not limited to prominent eustachian valve or thrombus.              - TTE repeat in ED, pending final report.  - Raw image reviewed by Dr. Odis Hollingshead who confirm no left or right atrial mass.  - no the parasternal short axis at the AV at 11 or 12 o'clock position there is echo lucency which is likely part of the TV annulus but given his use of crack/cocaine, poor dentition shared decision was to  proceed w/ transesophageal echocardiogram.   Atypical chest pain  - atypical symptom, only lasting few seconds at a time, occurs with cocaine use, body rotation, deep inspiration, bending over and exertion. Not good candidate for coronary CT given cocaine use and inability to take BB. Will trend trop, if second one remain negative, plan for outpatient myoview.   Crack cocaine abuse: last use yesterday  Tobacco abuse: daily smoker  H/o seizure  Hypertension: BP elevated, only on Cardura at home. SBP 50-60s, will avoid BB, consider add amlodipine 5mg  daily.    Hyperlipidemia: not on any statin medication. FLP tomorrow AM.    Risk Assessment/Risk Scores:     For questions or updates, please contact  [facility] Please consult [email or web address] for contact info under    Signed, Azalee Course, [place]  11/10/2023 2:37 PM  ADDENDUM:   Patient seen and examined with APP, Azalee Course PA.  I personally taken a history, examined the patient, reviewed relevant notes,  laboratory data / imaging studies.  I performed a substantive portion of this encounter and formulated the important aspects of the plan.  I agree with the APP's note, impression, and recommendations; however, I have edited the note to reflect changes or salient points.   Patient has been experiencing noncardiac chest pain which she describes as soreness over the anterior chest wall, reproducible with palpation, intermittent, not brought on by effort related activities, does not resolve with rest, and self-limited.  At his last visit with PCP and echocardiogram was performed and he was called to go to the ED due to concerns of atrial mass.  He denies heart failure symptoms.   PHYSICAL EXAM: Today's Vitals   11/10/23 1919 11/10/23 1925 11/10/23 2143 11/10/23 2304  BP: (!) 145/111   115/82  Pulse: 67   80  Resp: 19   20  Temp: 98.4 F (36.9 C)   98 F (36.7 C)  TempSrc: Oral   Oral  SpO2: 100%   99%  Weight:      Height:       PainSc:  0-No pain 0-No pain    Body mass index is 25.55 kg/m.   Net IO Since Admission: No IO data has been entered for this period [11/10/23 2341]  Filed Weights   11/10/23 1018  Weight: 78.5 kg    Physical Exam  Constitutional: No distress. He appears chronically ill.  hemodynamically stable  HENT:  Arcus senilis  Neck: No JVD present.  Cardiovascular: Normal rate, regular rhythm, S1 normal and S2 normal. Exam reveals no gallop, no S3 and no S4.  No murmur heard. Pulmonary/Chest: Effort normal and breath sounds normal. No stridor. He has no wheezes. He has no rales.  Abdominal: Soft. Bowel sounds are normal. He exhibits no distension. There is no abdominal tenderness.  Musculoskeletal:        General: No edema.     Cervical  back: Neck supple.     Comments: Third digit on left hand at approximately the PIP joint he has an open wound with drainage.  Neurological: He is alert and oriented to person, place, and time. He has intact cranial nerves (2-12).  Skin: Skin is warm.   EKG: (personally reviewed by me) 11/10/2023: Sinus rhythm, 79 bpm, consider lateral ischemia, without underlying injury pattern.  Telemetry: (personally reviewed by me) Sinus rhythm   Impression:  Abnormal echo Precordial pain. Chronic cocaine use. Hypertension. Hyperlipidemia. History of COVID-19 infection  Recommendations:  Patient was sent to the hospital due to an outside echo noting concern for left atrial mass.  Since those images were not available for review another echocardiogram was performed while he was in the ED to rule out acute pathology.  Patient does not have mass in either left or right atrium.  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 In the parasternal short axis at the aortic valve level there appears to be echolucency around 11 to 12 o'clock position either secondary to tricuspid valve vs. Pathology.  Given the concern on the current study, known history of  crack and cocaine use, and acute drainage from the PIP joint. He was scheduled for TEE 11/11/2023.   However, one of my partner Dr. Eden Emms also reviewed the current TTE images it was felt that her that the lwhat was seen before is likely ihs  Further recommendations to follow as the case evolves.   This note was created using a voice recognition software as a result there may be grammatical errors inadvertently enclosed that do not reflect the nature of this encounter. Every attempt is made to correct such errors.   Tessa Lerner, DO, [facility]  [address] [place], [place] [address] Pager: [phone] Office: [phone] 11/10/2023 11:41 PM

## 2023-11-10 NOTE — H&P (Signed)
History and Physical    Patient: Lance Walters UJW:[id number] DOB: 09/15/1956 DOA: 11/10/2023 DOS: the patient was seen and examined on 11/10/2023 PCP: Karenann Cai, NP  Patient coming from: Home  Chief Complaint:  Chief Complaint  Patient presents with   Chest Pain   HPI: Lance Walters is a 67 y.o. male with medical history significant of anxiety, depression, osteoarthritis, chronic neck and back pain, epilepsy, frequent headaches, GERD, hypertension, hyperlipidemia, osteoporosis, PTSD, seizures, substance abuse (smoked crack cocaine) who presented to the emergency department per cardiology request due to abnormal echocardiogram which was obtained due to recent episodes of chest pain for the past 6 months.  He decrease his daily use of crack cocaine to once every 2 to 3 days, but continues to have these CP episodes associated with mild dyspnea, but denied diaphoresis, nausea or emesis.  There is no specific pattern and the pain can happen with exertion or with activity. No PND, orthopnea or pitting edema of the lower extremities.He went to his PCP office and underwent workup including echocardiogram.  His echocardiogram showed a large, obstructing left atrial mass and was told to come to the emergency department. He denied fever, chills, rhinorrhea, sore throat, wheezing or hemoptysis.   No abdominal pain, nausea, emesis, diarrhea, constipation, melena or hematochezia.  No flank pain, dysuria, frequency or hematuria.  No polyuria, polydipsia, polyphagia or blurred vision.   Lab work: CBC, lipase and troponin were normal.  CMP showed a potassium of 3.2 mmol/L, glucose 150 and BUN 26 mg/dL, the rest of the CMP measurements were normal.  Imaging: Portable 1 view chest radiograph with normal cardiomediastinal silhouette and no acute disease.  Both lungs were clear.   ED course: Initial vital signs were temperature 98.2 F, pulse 83, respiration 15, BP 165/103 mmHg O2  sat 99% on room air.  Review of Systems: As mentioned in the history of present illness. All other systems reviewed and are negative. Past Medical History:  Diagnosis Date   Anxiety    Arthritis    Chronic neck and back pain    Depression    Epilepsy (HCC)    Frequent headaches    GERD (gastroesophageal reflux disease)    HTN (hypertension)    Hyperlipidemia    diet controlled, no medication   Osteoporosis    PTSD (post-traumatic stress disorder)    Seizures (HCC)    last one 2015, controlled with med    Substance abuse (HCC)    history cocaine use   Past Surgical History:  Procedure Laterality Date   no prior surgery     Social History:  reports that he has been smoking cigarettes. He has a 30 pack-year smoking history. He has never used smokeless tobacco. He reports current alcohol use of about 2.0 standard drinks of alcohol per week. He reports current drug use. Drug: Cocaine.  No Known Allergies  Family History  Problem Relation Age of Onset   Hypertension Mother    Diabetes Mother    Heart failure Mother    Heart disease Mother    Heart attack Father    Hypertension Father    Heart failure Father    Prostate cancer Father    Dementia Sister    Eczema Daughter    Lupus Daughter    Asthma Son    Asthma Son    Hypertension Son    Colon cancer Neg Hx    Colon polyps Neg Hx    Esophageal cancer Neg Hx  Rectal cancer Neg Hx    Stomach cancer Neg Hx     Prior to Admission medications   Medication Sig Start Date End Date Taking? Authorizing Provider  acetaminophen (TYLENOL) 500 MG tablet Take 500 mg by mouth every 8 (eight) hours as needed for moderate pain.    [provider]  calcium-vitamin D (OSCAL WITH D) 500-200 MG-UNIT per tablet Take 1 tablet by mouth daily with breakfast.    [provider]  cyclobenzaprine (FLEXERIL) 5 MG tablet Take 1 tablet (5 mg total) by mouth 2 (two) times daily as needed for muscle spasms. 12/15/13   Teressa Lower, NP  doxycycline (VIBRA-TABS) 100 MG tablet Take 1 tablet (100 mg total) by mouth 2 (two) times daily. 07/19/23   Linwood Dibbles, MD  gabapentin (NEURONTIN) 300 MG capsule Take 100 mg by mouth 3 (three) times daily.    [provider]  HydrOXYzine Pamoate (VISTARIL PO) Take 1 capsule by mouth at bedtime.    [provider]  levETIRAcetam (KEPPRA) 1000 MG tablet Take 1,000 mg by mouth daily.    [provider]  Meloxicam (MOBIC PO) Take 1 tablet by mouth at bedtime.    [provider]  sertraline (ZOLOFT) 50 MG tablet Take 150 mg by mouth daily.    [provider]  tamsulosin (FLOMAX) 0.4 MG CAPS capsule 0.4 mg. 09/18/20   [provider]    Physical Exam: Vitals:   11/10/23 1017 11/10/23 1018 11/10/23 1346 11/10/23 1416  BP: (!) 165/103  (!) 141/104   Pulse: 83  (!) 55   Resp: 15  16   Temp: 98.2 F (36.8 C)   97.9 F (36.6 C)  TempSrc: Oral   Oral  SpO2: 99%  99%   Weight:  78.5 kg    Height:  5\' 9"  (1.753 m)     Physical Exam Vitals reviewed.  Constitutional:      Appearance: He is obese.  HENT:     Head: Normocephalic.  Eyes:     Pupils: Pupils are equal, round, and reactive to light.  Neck:     Vascular: No JVD.  Musculoskeletal:     Cervical back: Neck supple.  Neurological:     Mental Status: He is alert.     Data Reviewed:  Results are pending, will review when available. EKG: Vent. rate 79 BPM PR interval 180 ms QRS duration 76 ms QT/QTcB 377/433 ms P-R-T axes 57 -74 -60 Sinus rhythm Left anterior fascicular block Abnormal R-wave progression, late transition Consider left ventricular hypertrophy Nonspecific T abnormalities, lateral leads  Echocardiogram done today. IMPRESSIONS:   1. 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  2. Left ventricular ejection fraction, by estimation, is 60 to 65%. The left ventricle has normal function. The left ventricle has no regional wall motion abnormalities.  Left ventricular diastolic parameters are consistent with Grade I diastolic dysfunction (impaired relaxation).  3. Right ventricular systolic function is normal. The right ventricular size is normal. There is normal pulmonary artery systolic pressure.  4. The mitral valve is normal in structure. Mild mitral valve regurgitation. No evidence of mitral stenosis.  5. The aortic valve is tricuspid. Aortic valve regurgitation is not visualized. No aortic stenosis is present.  6. The inferior vena cava is dilated in size with >50% respiratory variability, suggesting right atrial pressure of 8 mmHg.  Assessment and Plan: Principal Problem:   Chest pain   Abnormal echocardiogram There was a questionable mass on outside echocardiogram. However,  this is not as evident on today's echo. Continue telemetry observation. Discussed with Dr. Odis Hollingshead, TEE will be canceled for now. Blood cultures x 2 have been ordered. If the patient develops fever or BC positive, then TEE will be performed.  Active Problems:   Crack cocaine use Cessation advised. Briefly discussed cocaine cardiotoxicity. Consult TOC in AM. Continue fluoxetine for anxiety/depression.    Essential (primary) hypertension Monitor blood pressure. As needed antihypertensives. Avoid beta-blockers.    Hypokalemia Supplemented. Follow-up potassium level in AM.    Grade I diastolic dysfunction No signs of volume overload at the moment.    Advance Care Planning:   Code Status: Full Code   Consults: Cardiology (Dr. Emelda Brothers).  Family Communication:   Severity of Illness: The appropriate patient status for this patient is OBSERVATION. Observation status is judged to be reasonable and necessary in order to provide the required intensity of service to ensure the patient's safety. The patient's presenting symptoms, physical exam findings, and initial radiographic and laboratory data in the context of their medical condition is felt to  place them at decreased risk for further clinical deterioration. Furthermore, it is anticipated that the patient will be medically stable for discharge from the hospital within 2 midnights of admission.   Author: Bobette Mo, MD 11/10/2023 2:49 PM  For on call review [email or web address].   This document was prepared using Dragon voice recognition software and may contain some unintended transcription errors.

## 2023-11-10 NOTE — ED Notes (Signed)
ED TO INPATIENT HANDOFF REPORT  Name/Age/Gender Lance Walters 67 y.o. male  Code Status    Code Status Orders  (From admission, onward)           Start     Ordered   11/10/23 1612  Full code  Continuous       Question:  By:  Answer:  Consent: discussion documented in EHR   11/10/23 1612           Code Status History     This patient has a current code status but no historical code status.       Home/SNF/Other Home  Chief Complaint Chest pain [R07.9]  Level of Care/Admitting Diagnosis ED Disposition     ED Disposition  Admit   Condition  --   Comment  Hospital Area: [facility] [[id number]]  Level of Care: Telemetry [5]  Admit to tele based on following criteria: Monitor for Ischemic changes  May place patient in observation at [facility] or [facility] if equivalent level of care is available:: No  Covid Evaluation: Asymptomatic - no recent exposure (last 10 days) testing not required  Diagnosis: Chest pain [865784]  Admitting Physician: Bobette Mo [[id number]]  Attending Physician: Bobette Mo [[id number]]          Medical History Past Medical History:  Diagnosis Date   Anxiety    Arthritis    Chronic neck and back pain    Depression    Epilepsy (HCC)    Frequent headaches    GERD (gastroesophageal reflux disease)    HTN (hypertension)    Hyperlipidemia    diet controlled, no medication   Osteoporosis    PTSD (post-traumatic stress disorder)    Seizures (HCC)    last one 2015, controlled with med    Substance abuse (HCC)    history cocaine use    Allergies No Known Allergies  IV Location/Drains/Wounds Patient Lines/Drains/Airways Status     Active Line/Drains/Airways     None            Labs/Imaging Results for orders placed or performed during the hospital encounter of 11/10/23 (from the past 48 hour(s))  CBC with Differential     Status: None   Collection Time: 11/10/23 11:13  AM  Result Value Ref Range   WBC 7.6 4.0 - 10.5 K/uL   RBC 4.68 4.22 - 5.81 MIL/uL   Hemoglobin 14.4 13.0 - 17.0 g/dL   HCT 01.0 27.2 - 53.6 %   MCV 90.8 80.0 - 100.0 fL   MCH 30.8 26.0 - 34.0 pg   MCHC 33.9 30.0 - 36.0 g/dL   RDW 64.4 03.4 - 74.2 %   Platelets 239 150 - 400 K/uL   nRBC 0.0 0.0 - 0.2 %   Neutrophils Relative % 55 %   Neutro Abs 4.2 1.7 - 7.7 K/uL   Lymphocytes Relative 33 %   Lymphs Abs 2.5 0.7 - 4.0 K/uL   Monocytes Relative 8 %   Monocytes Absolute 0.6 0.1 - 1.0 K/uL   Eosinophils Relative 3 %   Eosinophils Absolute 0.3 0.0 - 0.5 K/uL   Basophils Relative 1 %   Basophils Absolute 0.1 0.0 - 0.1 K/uL   Immature Granulocytes 0 %   Abs Immature Granulocytes 0.03 0.00 - 0.07 K/uL    Comment: Performed at [facility], [address] [place], [place] [address]  Comprehensive metabolic panel     Status: Abnormal   Collection  Time: 11/10/23 11:13 AM  Result Value Ref Range   Sodium 138 135 - 145 mmol/L   Potassium 3.2 (L) 3.5 - 5.1 mmol/L   Chloride 105 98 - 111 mmol/L   CO2 23 22 - 32 mmol/L   Glucose, Bld 150 (H) 70 - 99 mg/dL    Comment: Glucose reference range applies only to samples taken after fasting for at least 8 hours.   BUN 26 (H) 8 - 23 mg/dL   Creatinine, Ser 1.61 0.61 - 1.24 mg/dL   Calcium 9.3 8.9 - 09.6 mg/dL   Total Protein 7.7 6.5 - 8.1 g/dL   Albumin 4.1 3.5 - 5.0 g/dL   AST 21 15 - 41 U/L   ALT 17 0 - 44 U/L   Alkaline Phosphatase 70 38 - 126 U/L   Total Bilirubin 0.9 <1.2 mg/dL   GFR, Estimated >04 >54 mL/min    Comment: (NOTE) Calculated using the CKD-EPI Creatinine Equation (2021)    Anion gap 10 5 - 15    Comment: Performed at [facility], [address], [place], [place] [address]  Troponin I (High Sensitivity)     Status: None   Collection Time: 11/10/23 11:13 AM  Result Value Ref Range   Troponin I (High Sensitivity) 7 <18 ng/L    Comment: (NOTE) Elevated high sensitivity troponin I  (hsTnI) values and significant  changes across serial measurements may suggest ACS but many other  chronic and acute conditions are known to elevate hsTnI results.  Refer to the "Links" section for chest pain algorithms and additional  guidance. Performed at [facility], [address] [place], [place] [address]   Lipase, blood     Status: None   Collection Time: 11/10/23 11:13 AM  Result Value Ref Range   Lipase 42 11 - 51 U/L    Comment: Performed at [facility], [address], [place], [place] [address]   ECHOCARDIOGRAM COMPLETE  Result Date: 11/10/2023    ECHOCARDIOGRAM REPORT   Patient Name:   Lance Walters Date of Exam: 11/10/2023 Medical Rec #:  [id number]                 Height:       69.0 in Accession #:    [id number]                Weight:       173.0 lb Date of Birth:  Jul 27, 1956                BSA:          1.943 m Patient Age:    66 years                  BP:           165/103 mmHg Patient Gender: M                         HR:           63 bpm. Exam Location:  Inpatient Procedure: 2D Echo, Cardiac Doppler and Color Doppler Indications:    Chest Pain  History:        Patient has no prior history of Echocardiogram examinations.                 Risk Factors:Hypertension and Dyslipidemia.  Sonographer:    Lucendia Herrlich RCS Referring Phys: [id number] [employer] IMPRESSIONS  1.  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  2. Left ventricular ejection fraction, by estimation, is 60 to 65%. The left ventricle has normal function. The left ventricle has no regional wall motion abnormalities. Left ventricular diastolic parameters are consistent with Grade I diastolic dysfunction (impaired relaxation).  3. Right ventricular systolic function is normal. The right ventricular size is normal. There is normal pulmonary artery systolic pressure.  4. The mitral valve is normal in structure. Mild mitral valve regurgitation. No evidence of mitral  stenosis.  5. The aortic valve is tricuspid. Aortic valve regurgitation is not visualized. No aortic stenosis is present.  6. The inferior vena cava is dilated in size with >50% respiratory variability, suggesting right atrial pressure of 8 mmHg. FINDINGS  Left Ventricle: Left ventricular ejection fraction, by estimation, is 60 to 65%. The left ventricle has normal function. The left ventricle has no regional wall motion abnormalities. The left ventricular internal cavity size was normal in size. There is  no left ventricular hypertrophy. Left ventricular diastolic parameters are consistent with Grade I diastolic dysfunction (impaired relaxation). Normal left ventricular filling pressure. Right Ventricle: The right ventricular size is normal. No increase in right ventricular wall thickness. Right ventricular systolic function is normal. There is normal pulmonary artery systolic pressure. The tricuspid regurgitant velocity is 1.80 m/s, and  with an assumed right atrial pressure of 8 mmHg, the estimated right ventricular systolic pressure is 21.0 mmHg. Left Atrium: Left atrial size was normal in size. Right Atrium: Right atrial size was normal in size. Prominent Chiari network. Pericardium: There is no evidence of pericardial effusion. Mitral Valve: The mitral valve is normal in structure. Mild mitral valve regurgitation. No evidence of mitral valve stenosis. Tricuspid Valve: The tricuspid valve is normal in structure. Tricuspid valve regurgitation is mild . No evidence of tricuspid stenosis. Aortic Valve: The aortic valve is tricuspid. Aortic valve regurgitation is not visualized. No aortic stenosis is present. Aortic valve peak gradient measures 3.9 mmHg. Pulmonic Valve: The pulmonic valve was normal in structure. Pulmonic valve regurgitation is trivial. No evidence of pulmonic stenosis. Aorta: The aortic root is normal in size and structure. Venous: The inferior vena cava is dilated in size with greater than 50%  respiratory variability, suggesting right atrial pressure of 8 mmHg. IAS/Shunts: No atrial level shunt detected by color flow Doppler.  LEFT VENTRICLE PLAX 2D LVIDd:         4.30 cm   Diastology LVIDs:         2.30 cm   LV e' medial:    5.77 cm/s LV PW:         1.00 cm   LV E/e' medial:  7.6 LV IVS:        0.90 cm   LV e' lateral:   7.83 cm/s LVOT diam:     2.40 cm   LV E/e' lateral: 5.6 LV SV:         76 LV SV Index:   39 LVOT Area:     4.52 cm  RIGHT VENTRICLE             IVC RV S prime:     14.10 cm/s  IVC diam: 2.50 cm TAPSE (M-mode): 2.2 cm LEFT ATRIUM             Index        RIGHT ATRIUM           Index LA diam:        3.40 cm 1.75 cm/m  RA Area:     13.10 cm LA Vol (A2C):   50.4 ml 25.95 ml/m  RA Volume:   29.00 ml  14.93 ml/m LA Vol (A4C):   40.3 ml 20.75 ml/m LA Biplane Vol: 47.6 ml 24.50 ml/m  AORTIC VALVE AV Area (Vmax): 3.71 cm AV Vmax:        98.80 cm/s AV Peak Grad:   3.9 mmHg LVOT Vmax:      80.97 cm/s LVOT Vmean:     54.450 cm/s LVOT VTI:       0.168 m  AORTA Ao Root diam: 3.30 cm Ao Asc diam:  3.80 cm MITRAL VALVE               TRICUSPID VALVE MV Area (PHT): 2.54 cm    TR Peak grad:   13.0 mmHg MV Decel Time: 299 msec    TR Vmax:        180.00 cm/s MV E velocity: 43.70 cm/s MV A velocity: 57.80 cm/s  SHUNTS MV E/A ratio:  0.76        Systemic VTI:  0.17 m                            Systemic Diam: 2.40 cm Chilton Si MD Electronically signed by Chilton Si MD Signature Date/Time: 11/10/2023/3:09:50 PM    Final    DG Chest Portable 1 View  Result Date: 11/10/2023 CLINICAL DATA:  Chest pain. EXAM: PORTABLE CHEST 1 VIEW COMPARISON:  11/07/2013. FINDINGS: Low lung volume. Bilateral lung fields are clear. Bilateral costophrenic angles are clear. Normal cardio-mediastinal silhouette. No acute osseous abnormalities. The soft tissues are within normal limits. IMPRESSION: *No active disease. Electronically Signed   By: Jules Schick M.D.   On: 11/10/2023 14:26    Pending  Labs Unresulted Labs (From admission, onward)     Start     Ordered   11/11/23 0500  CBC  Tomorrow morning,   R        11/10/23 1612   11/11/23 0500  Comprehensive metabolic panel  Tomorrow morning,   R        11/10/23 1612   11/10/23 1457  Magnesium  Add-on,   AD        11/10/23 1456   11/10/23 1457  Phosphorus  Add-on,   AD        11/10/23 1456            Vitals/Pain Today's Vitals   11/10/23 1017 11/10/23 1018 11/10/23 1346 11/10/23 1416  BP: (!) 165/103  (!) 141/104   Pulse: 83  (!) 55   Resp: 15  16   Temp: 98.2 F (36.8 C)   97.9 F (36.6 C)  TempSrc: Oral   Oral  SpO2: 99%  99%   Weight:  78.5 kg    Height:  5\' 9"  (1.753 m)    PainSc:  6       Isolation Precautions No active isolations  Medications Medications  potassium chloride SA (KLOR-CON M) CR tablet 40 mEq (has no administration in time range)  enoxaparin (LOVENOX) injection 40 mg (has no administration in time range)  acetaminophen (TYLENOL) tablet 650 mg (has no administration in time range)    Or  acetaminophen (TYLENOL) suppository 650 mg (has no administration in time range)  ondansetron (ZOFRAN) tablet 4 mg (has no administration in time range)    Or  ondansetron (ZOFRAN) injection 4 mg (has no administration in time  range)    Mobility walks

## 2023-11-10 NOTE — ED Triage Notes (Signed)
Patient arrives ambulatory by POV states he had an echo done 2 days ago and was told he had an abnormal growth on his heart and was sent here for further evaluation. States they did the echo due to abnormal lab work months ago. Reports slight pain in his left side of chest for a long time.

## 2023-11-11 ENCOUNTER — Observation Stay (HOSPITAL_COMMUNITY): Payer: Medicare Other

## 2023-11-11 ENCOUNTER — Encounter (HOSPITAL_COMMUNITY)
Admission: EM | Disposition: A | Discharge status: Home / Self Care | Payer: Self-pay | Source: Home / Self Care | Attending: Internal Medicine

## 2023-11-11 DIAGNOSIS — G8929 Other chronic pain: Secondary | ICD-10-CM | POA: Diagnosis present

## 2023-11-11 DIAGNOSIS — T405X1A Poisoning by cocaine, accidental (unintentional), initial encounter: Secondary | ICD-10-CM | POA: Diagnosis present

## 2023-11-11 DIAGNOSIS — F419 Anxiety disorder, unspecified: Secondary | ICD-10-CM | POA: Diagnosis present

## 2023-11-11 DIAGNOSIS — K219 Gastro-esophageal reflux disease without esophagitis: Secondary | ICD-10-CM | POA: Diagnosis present

## 2023-11-11 DIAGNOSIS — Z79899 Other long term (current) drug therapy: Secondary | ICD-10-CM | POA: Diagnosis not present

## 2023-11-11 DIAGNOSIS — M542 Cervicalgia: Secondary | ICD-10-CM | POA: Diagnosis present

## 2023-11-11 DIAGNOSIS — Z791 Long term (current) use of non-steroidal anti-inflammatories (NSAID): Secondary | ICD-10-CM | POA: Diagnosis not present

## 2023-11-11 DIAGNOSIS — G40909 Epilepsy, unspecified, not intractable, without status epilepticus: Secondary | ICD-10-CM | POA: Diagnosis present

## 2023-11-11 DIAGNOSIS — E782 Mixed hyperlipidemia: Secondary | ICD-10-CM | POA: Diagnosis present

## 2023-11-11 DIAGNOSIS — R0789 Other chest pain: Secondary | ICD-10-CM | POA: Diagnosis present

## 2023-11-11 DIAGNOSIS — F17200 Nicotine dependence, unspecified, uncomplicated: Secondary | ICD-10-CM

## 2023-11-11 DIAGNOSIS — F1490 Cocaine use, unspecified, uncomplicated: Secondary | ICD-10-CM | POA: Diagnosis not present

## 2023-11-11 DIAGNOSIS — I10 Essential (primary) hypertension: Secondary | ICD-10-CM | POA: Diagnosis present

## 2023-11-11 DIAGNOSIS — E876 Hypokalemia: Secondary | ICD-10-CM | POA: Diagnosis present

## 2023-11-11 DIAGNOSIS — F4310 Post-traumatic stress disorder, unspecified: Secondary | ICD-10-CM | POA: Diagnosis present

## 2023-11-11 DIAGNOSIS — M1990 Unspecified osteoarthritis, unspecified site: Secondary | ICD-10-CM | POA: Diagnosis present

## 2023-11-11 DIAGNOSIS — M549 Dorsalgia, unspecified: Secondary | ICD-10-CM | POA: Diagnosis present

## 2023-11-11 DIAGNOSIS — Z8249 Family history of ischemic heart disease and other diseases of the circulatory system: Secondary | ICD-10-CM | POA: Diagnosis not present

## 2023-11-11 DIAGNOSIS — R519 Headache, unspecified: Secondary | ICD-10-CM | POA: Diagnosis present

## 2023-11-11 DIAGNOSIS — H18419 Arcus senilis, unspecified eye: Secondary | ICD-10-CM | POA: Diagnosis present

## 2023-11-11 DIAGNOSIS — M810 Age-related osteoporosis without current pathological fracture: Secondary | ICD-10-CM | POA: Diagnosis present

## 2023-11-11 DIAGNOSIS — F17210 Nicotine dependence, cigarettes, uncomplicated: Secondary | ICD-10-CM | POA: Diagnosis present

## 2023-11-11 DIAGNOSIS — F1410 Cocaine abuse, uncomplicated: Secondary | ICD-10-CM | POA: Diagnosis present

## 2023-11-11 DIAGNOSIS — R931 Abnormal findings on diagnostic imaging of heart and coronary circulation: Secondary | ICD-10-CM | POA: Diagnosis not present

## 2023-11-11 DIAGNOSIS — R079 Chest pain, unspecified: Secondary | ICD-10-CM | POA: Diagnosis present

## 2023-11-11 DIAGNOSIS — F32A Depression, unspecified: Secondary | ICD-10-CM | POA: Diagnosis present

## 2023-11-11 LAB — CBC
HCT: 41.3 % (ref 39.0–52.0)
Hemoglobin: 13.5 g/dL (ref 13.0–17.0)
MCH: 30.1 pg (ref 26.0–34.0)
MCHC: 32.7 g/dL (ref 30.0–36.0)
MCV: 92 fL (ref 80.0–100.0)
Platelets: 232 10*3/uL (ref 150–400)
RBC: 4.49 MIL/uL (ref 4.22–5.81)
RDW: 12.8 % (ref 11.5–15.5)
WBC: 6.9 10*3/uL (ref 4.0–10.5)
nRBC: 0 % (ref 0.0–0.2)

## 2023-11-11 LAB — COMPREHENSIVE METABOLIC PANEL
ALT: 15 U/L (ref 0–44)
AST: 16 U/L (ref 15–41)
Albumin: 3.7 g/dL (ref 3.5–5.0)
Alkaline Phosphatase: 59 U/L (ref 38–126)
Anion gap: 7 (ref 5–15)
BUN: 23 mg/dL (ref 8–23)
CO2: 23 mmol/L (ref 22–32)
Calcium: 8.9 mg/dL (ref 8.9–10.3)
Chloride: 107 mmol/L (ref 98–111)
Creatinine, Ser: 0.94 mg/dL (ref 0.61–1.24)
GFR, Estimated: >60 mL/min (ref >60)
Glucose, Bld: 94 mg/dL (ref 70–99)
Potassium: 3.8 mmol/L (ref 3.5–5.1)
Sodium: 137 mmol/L (ref 135–145)
Total Bilirubin: 0.9 mg/dL (ref <1.2)
Total Protein: 6.9 g/dL (ref 6.5–8.1)

## 2023-11-11 LAB — D-DIMER, QUANTITATIVE: D-Dimer, Quant: 2.41 ug{FEU}/mL — ABNORMAL HIGH (ref 0.00–0.50)

## 2023-11-11 SURGERY — TRANSESOPHAGEAL ECHOCARDIOGRAM (TEE) (CATHLAB)
Anesthesia: Monitor Anesthesia Care

## 2023-11-11 MED ORDER — AMLODIPINE BESYLATE 10 MG PO TABS
5.0000 mg | ORAL_TABLET | Freq: Every day | ORAL | Status: DC
  Administered 2023-11-12: 5 mg via ORAL
  Filled 2023-11-11: qty 1

## 2023-11-11 MED ORDER — IOHEXOL 350 MG/ML SOLN
75.0000 mL | Freq: Once | INTRAVENOUS | Status: AC | PRN
Start: 2023-11-11 — End: 2023-11-11
  Administered 2023-11-11: 75 mL via INTRAVENOUS

## 2023-11-11 MED ORDER — SODIUM CHLORIDE (PF) 0.9 % IJ SOLN
INTRAMUSCULAR | Status: AC
  Filled 2023-11-11: qty 50

## 2023-11-11 MED ORDER — AMLODIPINE BESYLATE 5 MG PO TABS
2.5000 mg | ORAL_TABLET | Freq: Every day | ORAL | Status: DC
  Administered 2023-11-11: 2.5 mg via ORAL
  Filled 2023-11-11: qty 1

## 2023-11-11 MED ORDER — LOSARTAN POTASSIUM 25 MG PO TABS
25.0000 mg | ORAL_TABLET | Freq: Every evening | ORAL | Status: DC
  Administered 2023-11-11: 25 mg via ORAL
  Filled 2023-11-11: qty 1

## 2023-11-11 NOTE — Progress Notes (Signed)
PROGRESS NOTE   Lance Walters  UEA:[id number] DOB: 01/29/1956 DOA: 11/10/2023 PCP: Karenann Cai, NP   Date of Service: the patient was seen and examined on 11/11/2023  Brief Narrative:  67 y.o. male with past medical history of gastroesophageal reflux disease, hypertension, hyperlipidemia, PTSD, seizure disorder, substance abuse who presented to [facility] emergency department at the direction of his cardiologist due to bouts of chest discomfort and an abnormal echocardiogram.  His echocardiogram showed a large, obstructing left atrial mass and was told to come to the emergency department.  Emergency department course was unremarkable.  Case was discussed with cardiology and the hospitalist group was then consulted to assess the patient for admission to the hospital.  Cardiology consultation was additionally obtained.     Assessment & Plan Chest pain On evaluation, chest discomfort is extremely atypical and reproducible on exam. Patient was informed that his echocardiogram in the outpatient setting revealed evidence of a possible mass however repeat echocardiogram here in the hospital does not confirm this finding. Monitoring patient on telemetry Cycling cardiac enzymes Evaluating patient for any evidence of infective endocarditis with blood cultures\ Case discussed with cardiology, will continue to monitor patient overnight and the cultures continue to remain negative tomorrow we will consider discharge. Abnormal finding on echocardiogram Please see assessment and plan above. Cocaine use Counseling daily on cessation Ongoing cocaine use in the outpatient setting is likely contributing to the patient's chest discomfort. Essential hypertension  Titrate antihypertensive regimen as necessary to achieve adequate BP control PRN intravenous antihypertensives for excessively elevated blood pressure  Hypokalemia Replacing with potassium chloride Evaluating  for concurrent hypomagnesemia  Monitoring potassium levels with serial chemistries.  Nicotine dependence, cigarettes, uncomplicated Patient is being counseled daily on smoking cessation. Providing patient with nicotine replacement therapy during this hospitalization.       Subjective:  Patient complaining of intermittent chest discomfort, moderate intensity, sharp in quality, midsternal in location, nonradiating without any association with exertion.  Physical Exam:  Vitals:   11/10/23 2304 11/11/23 0306 11/11/23 0700 11/11/23 1311  BP: 115/82 (!) 129/93 (!) 147/78 127/81  Pulse: 80 (!) 57 (!) 58 89  Resp: 20 20  18   Temp: 98 F (36.7 C) 97.6 F (36.4 C) 97.6 F (36.4 C) 98.3 F (36.8 C)  TempSrc: Oral Oral Oral Oral  SpO2: 99% 98% 98% 100%  Weight:      Height:         Constitutional: Awake alert and oriented x3, no associated distress.   Skin: no rashes, no lesions, good skin turgor noted. Eyes: Pupils are equally reactive to light.  No evidence of scleral icterus or conjunctival pallor.  ENMT: Moist mucous membranes noted.  Posterior pharynx clear of any exudate or lesions.   Respiratory: clear to auscultation bilaterally, no wheezing, no crackles. Normal respiratory effort. No accessory muscle use.  Cardiovascular: Reproducible anterior chest wall tenderness without crepitus or deformity.  Regular rate and rhythm, no murmurs / rubs / gallops. No extremity edema. 2+ pedal pulses. No carotid bruits.  Abdomen: Abdomen is soft and nontender.  No evidence of intra-abdominal masses.  Positive bowel sounds noted in all quadrants.   Musculoskeletal: No joint deformity upper and lower extremities. Good ROM, no contractures. Normal muscle tone.    Data Reviewed:  I have personally reviewed and interpreted labs, imaging.  Significant findings are   CBC: Recent Labs  Lab 11/10/23 1113 11/11/23 0354  WBC 7.6 6.9  NEUTROABS 4.2  --   HGB  14.4 13.5  HCT 42.5 41.3  MCV  90.8 92.0  PLT 239 232   Basic Metabolic Panel: Recent Labs  Lab 11/10/23 1113 11/10/23 1643 11/11/23 0354  NA 138  --  137  K 3.2*  --  3.8  CL 105  --  107  CO2 23  --  23  GLUCOSE 150*  --  94  BUN 26*  --  23  CREATININE 1.08  --  0.94  CALCIUM 9.3  --  8.9  MG  --  2.1  --   PHOS  --  3.7  --    GFR: Estimated Creatinine Clearance: 77.3 mL/min (by C-G formula based on SCr of 0.94 mg/dL). Liver Function Tests: Recent Labs  Lab 11/10/23 1113 11/11/23 0354  AST 21 16  ALT 17 15  ALKPHOS 70 59  BILITOT 0.9 0.9  PROT 7.7 6.9  ALBUMIN 4.1 3.7    EKG/Telemetry: Personally reviewed.  Rhythm is normal sinus rhythm with left anterior fascicular block with heart rate of 79bpm.  No dynamic ST segment changes appreciated.   Code Status:  Full code.  Code status decision has been confirmed with: patient    Severity of Illness:  The appropriate patient status for this patient is INPATIENT. Inpatient status is judged to be reasonable and necessary in order to provide the required intensity of service to ensure the patient's safety. The patient's presenting symptoms, physical exam findings, and initial radiographic and laboratory data in the context of their chronic comorbidities is felt to place them at high risk for further clinical deterioration. Furthermore, it is not anticipated that the patient will be medically stable for discharge from the hospital within 2 midnights of admission.   * I certify that at the point of admission it is my clinical judgment that the patient will require inpatient hospital care spanning beyond 2 midnights from the point of admission due to high intensity of service, high risk for further deterioration and high frequency of surveillance required.*  Time spent:  50 minutes  Author:  Marinda Elk MD  11/11/2023 1:12 PM

## 2023-11-11 NOTE — Plan of Care (Addendum)
Patient alert and ambulatory. No TEE scheduled today, diet order in place. Possible DC today. No seizure activity witnessed during shift.   Problem: Education: Goal: Knowledge of General Education information will improve Description: Including pain rating scale, medication(s)/side effects and non-pharmacologic comfort measures Outcome: Progressing   Problem: Health Behavior/Discharge Planning: Goal: Ability to manage health-related needs will improve Outcome: Progressing   Problem: Clinical Measurements: Goal: Ability to maintain clinical measurements within normal limits will improve Outcome: Progressing Goal: Will remain free from infection Outcome: Progressing Goal: Diagnostic test results will improve Outcome: Progressing Goal: Respiratory complications will improve Outcome: Progressing Goal: Cardiovascular complication will be avoided Outcome: Progressing   Problem: Activity: Goal: Risk for activity intolerance will decrease Outcome: Progressing   Problem: Nutrition: Goal: Adequate nutrition will be maintained Outcome: Progressing   Problem: Coping: Goal: Level of anxiety will decrease Outcome: Progressing   Problem: Elimination: Goal: Will not experience complications related to bowel motility Outcome: Progressing Goal: Will not experience complications related to urinary retention Outcome: Progressing   Problem: Pain Management: Goal: General experience of comfort will improve Outcome: Progressing   Problem: Safety: Goal: Ability to remain free from injury will improve Outcome: Progressing   Problem: Skin Integrity: Goal: Risk for impaired skin integrity will decrease Outcome: Progressing

## 2023-11-11 NOTE — Hospital Course (Addendum)
67 y.o. male with past medical history of gastroesophageal reflux disease, hypertension, hyperlipidemia, PTSD, seizure disorder, substance abuse who presented to [facility] emergency department at the direction of his cardiologist due to bouts of chest discomfort and an abnormal echocardiogram.  His echocardiogram showed a large, obstructing left atrial mass and was told to come to the emergency department.  Emergency department course was unremarkable.  Case was discussed with cardiology and the hospitalist group was then consulted to assess the patient for admission to the hospital.  Cardiology consultation with Dr. Odis Hollingshead was additionally obtained.  An echocardiogram was obtained during this hospitalization which did not confirm any of the findings feared in the outpatient setting.  Blood cultures were obtained in case of the possibility of infective endocarditis which remain negative throughout the hospitalization.  Patient underwent cardiac enzymes which were unremarkable as well as telemetry monitoring which was additionally unremarkable.  While patient did complain of some chest discomfort throughout the hospitalization this was felt to be atypical and not secondary to plaque rupture.  Concerning patient's ongoing cocaine abuse patient was counseled on cessation throughout the hospitalization.  Patient was monitored throughout the hospitalization with cultures revealing no growth.  With the advisement of cardiology patient was discharged home on 11/12/2023 in improved and stable condition and arrangements were made for the patient to follow closely in the outpatient setting for continued monitoring and evaluation.

## 2023-11-11 NOTE — TOC Initial Note (Signed)
Transition of Care [facility]) - Initial/Assessment Note    Patient Details  Name: Lance Walters MRN: [id number] Date of Birth: 1956-02-21  Transition of Care [facility]) CM/SW Contact:    Larrie Kass, LCSW Phone Number: 11/11/2023, 12:23 PM  Clinical Narrative:                 CSW met with pt at bedside to discuss SDOH and substance resources. Pt reported having trouble with getting food stamps due to his prison history. CSW provided list of food pantries and a place to get a free meal in [place]. Pt has agreed to substance use resources. CSW has attached to pt's AVS. Pt stated he will need transportation assistance home upon d/c. CSW explained pt can receive bus pass at time of d/c, someone will follow up. TOC to follow.      Expected Discharge Plan: Home/Self Care Barriers to Discharge: Continued Medical Work up   Patient Goals and CMS Choice Patient states their goals for this hospitalization and ongoing recovery are:: return home          Expected Discharge Plan and Services       Living arrangements for the past 2 months: Apartment                                      Prior Living Arrangements/Services Living arrangements for the past 2 months: Apartment Lives with:: Self Patient language and need for interpreter reviewed:: Yes        Need for Family Participation in Patient Care: No (Comment) Care giver support system in place?: No (comment)   Criminal Activity/Legal Involvement Pertinent to Current Situation/Hospitalization: No - Comment as needed  Activities of Daily Living   ADL Screening (condition at time of admission) Independently performs ADLs?: Yes (appropriate for developmental age) Is the patient deaf or have difficulty hearing?: No Does the patient have difficulty seeing, even when wearing glasses/contacts?: No Does the patient have difficulty concentrating, remembering, or making decisions?: No  Permission  Sought/Granted                  Emotional Assessment Appearance:: Appears stated age Attitude/Demeanor/Rapport: Gracious, Engaged Affect (typically observed): Accepting Orientation: : Oriented to Self, Oriented to Place, Oriented to  Time, Oriented to Situation   Psych Involvement: No (comment)  Admission diagnosis:  Chest pain [R07.9] Chest pain, unspecified type [R07.9] Patient Active Problem List   Diagnosis Date Noted   Smoking 11/11/2023   Chest pain 11/10/2023   Hypokalemia 11/10/2023   Abnormal echocardiogram 11/10/2023   Grade I diastolic dysfunction 11/10/2023   Polyp of colon 03/12/2022   Diverticulosis 03/12/2022   Incontinence of feces 12/14/2021   Left lower quadrant pain 12/14/2021   Pyuria 12/14/2021   Bright red stool 09/21/2021   Polyneuropathy, unspecified 02/04/2021   Abnormal renal function test 09/18/2020   Arthritis of right hip 09/18/2020   Neuropathy 09/18/2020   Spinal stenosis in cervical region 09/18/2020   Spinal stenosis of lumbosacral region 09/18/2020   Back pain 08/15/2020   Benign prostatic hyperplasia without lower urinary tract symptoms 08/15/2020   Cocaine use 08/15/2020   HTN (hypertension), benign 08/15/2020   Seizure disorder (HCC) 08/15/2020   Substance use disorder 08/15/2020   PCP:  Karenann Cai, NP Pharmacy:   [facility] [place], [place] - [address] [place] [address] Phone:  [phone] Fax: [phone]  [facility] [address] [place] [address] Phone: [phone] Fax: [phone]  [id number] [place], [place] - [address] AT [address] [place] [address] Phone: [phone] Fax: [phone]  [facility] [place], [place] - [address] [place] [address] Phone: [phone] Fax: [phone]     Social Determinants of Health  (SDOH) Social History: SDOH Screenings   Food Insecurity: Food Insecurity Present (11/11/2023)  Transportation Needs: Unmet Transportation Needs (11/11/2023)  Social Connections: Unknown (05/11/2022)   Received from [facility]  Tobacco Use: High Risk (11/10/2023)   SDOH Interventions: Food Insecurity Interventions: Intervention Not Indicated, Inpatient TOC (provided food pantry) Transportation Interventions: Intervention Not Indicated, Inpatient TOC (taxi voucher)   Readmission Risk Interventions     No data to display

## 2023-11-11 NOTE — Care Management Obs Status (Signed)
MEDICARE OBSERVATION STATUS NOTIFICATION   Patient Details  Name: Lance Walters MRN: [id number] Date of Birth: Nov 11, 1956   Medicare Observation Status Notification Given:  Yes    Larrie Kass, LCSW 11/11/2023, 2:19 PM

## 2023-11-11 NOTE — Progress Notes (Addendum)
Rounding Note    Patient Name: Lance Walters Date of Encounter: 11/11/2023  [facility] Cardiologist: None   Subjective   Denies any SOB, still has occasional CP lasting seconds at a time.   Inpatient Medications    Scheduled Meds:  [START ON 11/12/2023] amLODipine  5 mg Oral Daily   enoxaparin (LOVENOX) injection  40 mg Subcutaneous Q24H   FLUoxetine  20 mg Oral QHS   gabapentin  100 mg Oral TID   levETIRAcetam  500 mg Oral BID   losartan  25 mg Oral QPM   nicotine  14 mg Transdermal Daily   tamsulosin  0.4 mg Oral Daily   Continuous Infusions:  PRN Meds: acetaminophen **OR** acetaminophen, cyclobenzaprine, influenza vaccine adjuvanted, ondansetron **OR** ondansetron (ZOFRAN) IV, mouth rinse, pneumococcal 20-valent conjugate vaccine   Vital Signs    Vitals:   11/10/23 2304 11/11/23 0306 11/11/23 0700 11/11/23 1311  BP: 115/82 (!) 129/93 (!) 147/78 127/81  Pulse: 80 (!) 57 (!) 58 89  Resp: 20 20  18   Temp: 98 F (36.7 C) 97.6 F (36.4 C) 97.6 F (36.4 C) 98.3 F (36.8 C)  TempSrc: Oral Oral Oral Oral  SpO2: 99% 98% 98% 100%  Weight:      Height:        Intake/Output Summary (Last 24 hours) at 11/11/2023 1503 Last data filed at 11/11/2023 1200 Gross per 24 hour  Intake 600 ml  Output --  Net 600 ml      11/10/2023   10:18 AM 07/19/2023   10:04 AM 02/16/2022    2:48 PM  Last 3 Weights  Weight (lbs) 173 lb 173 lb 1 oz 173 lb  Weight (kg) 78.472 kg 78.5 kg 78.472 kg      Telemetry    NSR without significant ventricular ectopy - Personally Reviewed  ECG    NSR with TWI in the lateral leads - Personally Reviewed  Physical Exam   GEN: No acute distress.   Neck: No JVD Cardiac: RRR, no murmurs, rubs, or gallops.  Respiratory: Clear to auscultation bilaterally. GI: Soft, nontender, non-distended  MS: No edema; No deformity. Neuro:  Nonfocal  Psych: Normal affect   Labs    High Sensitivity Troponin:   Recent Labs   Lab 11/10/23 1113 11/10/23 1641  TROPONINIHS 7 5     Chemistry Recent Labs  Lab 11/10/23 1113 11/10/23 1643 11/11/23 0354  NA 138  --  137  K 3.2*  --  3.8  CL 105  --  107  CO2 23  --  23  GLUCOSE 150*  --  94  BUN 26*  --  23  CREATININE 1.08  --  0.94  CALCIUM 9.3  --  8.9  MG  --  2.1  --   PROT 7.7  --  6.9  ALBUMIN 4.1  --  3.7  AST 21  --  16  ALT 17  --  15  ALKPHOS 70  --  59  BILITOT 0.9  --  0.9  GFRNONAA >60  --  >60  ANIONGAP 10  --  7    Lipids No results for input(s): "CHOL", "TRIG", "HDL", "LABVLDL", "LDLCALC", "CHOLHDL" in the last 168 hours.  Hematology Recent Labs  Lab 11/10/23 1113 11/11/23 0354  WBC 7.6 6.9  RBC 4.68 4.49  HGB 14.4 13.5  HCT 42.5 41.3  MCV 90.8 92.0  MCH 30.8 30.1  MCHC 33.9 32.7  RDW 12.8 12.8  PLT 239 232  Thyroid No results for input(s): "TSH", "FREET4" in the last 168 hours.  BNPNo results for input(s): "BNP", "PROBNP" in the last 168 hours.  DDimer  Recent Labs  Lab 11/11/23 1335  DDIMER 2.41*     Radiology    ECHOCARDIOGRAM COMPLETE  Result Date: 11/10/2023    ECHOCARDIOGRAM REPORT   Patient Name:   Lance Walters Date of Exam: 11/10/2023 Medical Rec #:  [id number]                 Height:       69.0 in Accession #:    [id number]                Weight:       173.0 lb Date of Birth:  Sep 20, 1956                BSA:          1.943 m Patient Age:    66 years                  BP:           165/103 mmHg Patient Gender: M                         HR:           63 bpm. Exam Location:  Inpatient Procedure: 2D Echo, Cardiac Doppler and Color Doppler Indications:    Chest Pain  History:        Patient has no prior history of Echocardiogram examinations.                 Risk Factors:Hypertension and Dyslipidemia.  Sonographer:    Lucendia Herrlich RCS Referring Phys: [id number] [employer] COUNTRYMAN IMPRESSIONS  1. 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  2. Left ventricular ejection fraction, by estimation, is 60 to 65%.  The left ventricle has normal function. The left ventricle has no regional wall motion abnormalities. Left ventricular diastolic parameters are consistent with Grade I diastolic dysfunction (impaired relaxation).  3. Right ventricular systolic function is normal. The right ventricular size is normal. There is normal pulmonary artery systolic pressure.  4. The mitral valve is normal in structure. Mild mitral valve regurgitation. No evidence of mitral stenosis.  5. The aortic valve is tricuspid. Aortic valve regurgitation is not visualized. No aortic stenosis is present.  6. The inferior vena cava is dilated in size with >50% respiratory variability, suggesting right atrial pressure of 8 mmHg. FINDINGS  Left Ventricle: Left ventricular ejection fraction, by estimation, is 60 to 65%. The left ventricle has normal function. The left ventricle has no regional wall motion abnormalities. The left ventricular internal cavity size was normal in size. There is  no left ventricular hypertrophy. Left ventricular diastolic parameters are consistent with Grade I diastolic dysfunction (impaired relaxation). Normal left ventricular filling pressure. Right Ventricle: The right ventricular size is normal. No increase in right ventricular wall thickness. Right ventricular systolic function is normal. There is normal pulmonary artery systolic pressure. The tricuspid regurgitant velocity is 1.80 m/s, and  with an assumed right atrial pressure of 8 mmHg, the estimated right ventricular systolic pressure is 21.0 mmHg. Left Atrium: Left atrial size was normal in size. Right Atrium: Right atrial size was normal in size. Prominent Chiari network. Pericardium: There is no evidence of pericardial effusion. Mitral Valve: The mitral valve is normal in structure. Mild mitral valve regurgitation. No evidence of mitral valve  stenosis. Tricuspid Valve: The tricuspid valve is normal in structure. Tricuspid valve regurgitation is mild . No evidence of  tricuspid stenosis. Aortic Valve: The aortic valve is tricuspid. Aortic valve regurgitation is not visualized. No aortic stenosis is present. Aortic valve peak gradient measures 3.9 mmHg. Pulmonic Valve: The pulmonic valve was normal in structure. Pulmonic valve regurgitation is trivial. No evidence of pulmonic stenosis. Aorta: The aortic root is normal in size and structure. Venous: The inferior vena cava is dilated in size with greater than 50% respiratory variability, suggesting right atrial pressure of 8 mmHg. IAS/Shunts: No atrial level shunt detected by color flow Doppler.  LEFT VENTRICLE PLAX 2D LVIDd:         4.30 cm   Diastology LVIDs:         2.30 cm   LV e' medial:    5.77 cm/s LV PW:         1.00 cm   LV E/e' medial:  7.6 LV IVS:        0.90 cm   LV e' lateral:   7.83 cm/s LVOT diam:     2.40 cm   LV E/e' lateral: 5.6 LV SV:         76 LV SV Index:   39 LVOT Area:     4.52 cm  RIGHT VENTRICLE             IVC RV S prime:     14.10 cm/s  IVC diam: 2.50 cm TAPSE (M-mode): 2.2 cm LEFT ATRIUM             Index        RIGHT ATRIUM           Index LA diam:        3.40 cm 1.75 cm/m   RA Area:     13.10 cm LA Vol (A2C):   50.4 ml 25.95 ml/m  RA Volume:   29.00 ml  14.93 ml/m LA Vol (A4C):   40.3 ml 20.75 ml/m LA Biplane Vol: 47.6 ml 24.50 ml/m  AORTIC VALVE AV Area (Vmax): 3.71 cm AV Vmax:        98.80 cm/s AV Peak Grad:   3.9 mmHg LVOT Vmax:      80.97 cm/s LVOT Vmean:     54.450 cm/s LVOT VTI:       0.168 m  AORTA Ao Root diam: 3.30 cm Ao Asc diam:  3.80 cm MITRAL VALVE               TRICUSPID VALVE MV Area (PHT): 2.54 cm    TR Peak grad:   13.0 mmHg MV Decel Time: 299 msec    TR Vmax:        180.00 cm/s MV E velocity: 43.70 cm/s MV A velocity: 57.80 cm/s  SHUNTS MV E/A ratio:  0.76        Systemic VTI:  0.17 m                            Systemic Diam: 2.40 cm Chilton Si MD Electronically signed by Chilton Si MD Signature Date/Time: 11/10/2023/3:09:50 PM    Final    DG Chest Portable 1  View  Result Date: 11/10/2023 CLINICAL DATA:  Chest pain. EXAM: PORTABLE CHEST 1 VIEW COMPARISON:  11/07/2013. FINDINGS: Low lung volume. Bilateral lung fields are clear. Bilateral costophrenic angles are clear. Normal cardio-mediastinal silhouette. No acute osseous abnormalities. The soft tissues are within  normal limits. IMPRESSION: *No active disease. Electronically Signed   By: Jules Schick M.D.   On: 11/10/2023 14:26    Cardiac Studies   Echo 11/10/2023 1. 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   2. Left ventricular ejection fraction, by estimation, is 60 to 65%. The  left ventricle has normal function. The left ventricle has no regional  wall motion abnormalities. Left ventricular diastolic parameters are  consistent with Grade I diastolic  dysfunction (impaired relaxation).   3. Right ventricular systolic function is normal. The right ventricular  size is normal. There is normal pulmonary artery systolic pressure.   4. The mitral valve is normal in structure. Mild mitral valve  regurgitation. No evidence of mitral stenosis.   5. The aortic valve is tricuspid. Aortic valve regurgitation is not  visualized. No aortic stenosis is present.   6. The inferior vena cava is dilated in size with >50% respiratory  variability, suggesting right atrial pressure of 8 mmHg.   Patient Profile     67 y.o. male a hx of  PTSD, crack cocaine abuse (smoke, not injection), tobacco abuse, history of seizure, hypertension and hyperlipidemia who is being seen 11/10/2023 for the evaluation of an abnormal echo done at PCP office   Assessment & Plan    Abnormal echocardiogram             - Patient was sent to the hospital for "mass in the heart". Based on outside report, EF 55-60%, moderate LVH, mild MR, mild mitral valve thickening, long echo linear mobile mass seen in the right atrium, due to limited visualization unstable to further characterize the finding or assess its size. Differential  diagnosis include but not limited to prominent eustachian valve or thrombus.              - TTE 11/10/2023 here showed EF 60-65%, chiari network in R atrium, grade 1 DD, mild MR. No plan for TEE. Blood culture negative so far (not final), temp normal.    Atypical chest pain             - atypical symptom, only lasting few seconds at a time, occurs with cocaine use, body rotation, deep inspiration, bending over and exertion.  Pain is reproducible palpitations.  Not good candidate for coronary CT given cocaine use and inability to take BB. Serial enzyme negative, plan for outpatient myoview if he can be compliant with follow up.    Crack cocaine abuse: last use yesterday   Tobacco abuse: daily smoker   H/o seizure   Hypertension: BP elevated, only on Cardura at home. Avoid BB esp with cocaine use.  Add amlodipine 5 mg p.o. every a.m. and losartan 25 mg p.o. every p.m.   Hyperlipidemia: not on any statin medication.     For questions or updates, please contact [facility] Please consult [email or web address] for contact info under      Signed, Azalee Course, PA 11/11/2023,   ADDENDUM:   Patient seen and examined with APP, Azalee Course, PA.  I personally taken a history, examined the patient, reviewed relevant notes,  laboratory data / imaging studies.  I performed a substantive portion of this encounter and formulated the important aspects of the plan.  I agree with the APP's note, impression, and recommendations; however, I have edited the note to reflect changes or salient points.  No events overnight. Remains afebrile. Reproducible chest pain   PHYSICAL EXAM: Today's Vitals   11/11/23 0306 11/11/23 0700 11/11/23 1000 11/11/23  1311  BP: (!) 129/93 (!) 147/78  127/81  Pulse: (!) 57 (!) 58  89  Resp: 20   18  Temp: 97.6 F (36.4 C) 97.6 F (36.4 C)  98.3 F (36.8 C)  TempSrc: Oral Oral  Oral  SpO2: 98% 98%  100%  Weight:      Height:      PainSc:   0-No pain    Body mass  index is 25.55 kg/m.   Net IO Since Admission: 600 mL [11/11/23 1503]  Filed Weights   11/10/23 1018  Weight: 78.5 kg    Physical Exam  Constitutional: No distress. He appears chronically ill.  hemodynamically stable  HENT:  Arcus senilis  Neck: No JVD present.  Cardiovascular: Normal rate, regular rhythm, S1 normal and S2 normal. Exam reveals no gallop, no S3 and no S4.  No murmur heard. Pulmonary/Chest: Effort normal and breath sounds normal. No stridor. He has no wheezes. He has no rales.  Abdominal: Soft. Bowel sounds are normal. He exhibits no distension. There is no abdominal tenderness.  Musculoskeletal:        General: No edema.     Cervical back: Neck supple.  Neurological: He is alert and oriented to person, place, and time. He has intact cranial nerves (2-12).  Skin: Skin is warm.    EKG: (personally reviewed by me) No new EKG.  Telemetry: (personally reviewed by me) Sinus rhythm   Impression:  Precordial pain ?  Abnormal outpatient echo Crack and cocaine. Cigarette smoker. History of seizures. Hypertension. Hyperlipidemia  Recommendations:  Precordial pain: Noncardiac based on symptoms. EKG does not illustrate STEMI. High sensitive troponins negative x 2. Echo notes preserved LVEF, no regional wall motion abnormalities. Not a candidate for coronary CTA at this time due to recent use of cocaine -will be able to give beta-blockers. Recommend outpatient follow-up with either Myoview or stress echo. Spoke to the daughter Delorise Jackson over the phone she is agreeable with the plan of care and is thankful  Abnormal outpatient echo: He came into the emergency for evaluation of " mass in the heart." Outside echo report requested-results reviewed. Repeat transthoracic echo done in the hospital notes preserved EF, no obvious mass, no regional wall motion abnormalities. Initial plan was to proceed forward with transesophageal echocardiogram to make sure discussing with  fellow colleagues that shared decision was to proceed with blood cultures and to monitor for fevers.  If he remains afebrile and the blood cultures are no growth to date no true indication for TEE at this time. This was discussed with attending physician on rounds. If he remains afebrile blood cultures are negative he can go home with outpatient follow-up. Updated his daughter Delorise Jackson as well who is in agreement.  Crack and cocaine: Last used-this week.  Reemphasized the importance of complete cessation.  He is aware that chronic use of cocaine can lead to cardiovascular comorbidities which include but not limited to myocardial infarction, controlled hypertension leading to stroke, and cardiomyopathy.  Tobacco use: Reemphasized importance of complete cessation.  Hyperlipidemia: Not on statin therapy.  Will request fasting lipid profile rechecked and treated if needed.  He does have arcus senilis on physical examination  Hypertension: Blood pressures have been elevated.  Agree with blood pressure medication changes as noted above  Cardiology will sign off for now. Please reach out over the weekend if he becomes febrile or has positive blood cultures. Otherwise we will arrange outpatient follow-up for reasons mentioned above.  Further recommendations to follow  as the case evolves.   This note was created using a voice recognition software as a result there may be grammatical errors inadvertently enclosed that do not reflect the nature of this encounter. Every attempt is made to correct such errors.   Tessa Lerner, DO, [facility]  [address] [place], [place] [address] Pager: [phone] Office: [phone] 11/11/2023 3:03 PM

## 2023-11-12 DIAGNOSIS — I10 Essential (primary) hypertension: Secondary | ICD-10-CM | POA: Diagnosis not present

## 2023-11-12 DIAGNOSIS — E876 Hypokalemia: Secondary | ICD-10-CM

## 2023-11-12 DIAGNOSIS — F17210 Nicotine dependence, cigarettes, uncomplicated: Secondary | ICD-10-CM

## 2023-11-12 DIAGNOSIS — R931 Abnormal findings on diagnostic imaging of heart and coronary circulation: Secondary | ICD-10-CM | POA: Diagnosis not present

## 2023-11-12 DIAGNOSIS — F1490 Cocaine use, unspecified, uncomplicated: Secondary | ICD-10-CM | POA: Diagnosis not present

## 2023-11-12 DIAGNOSIS — R079 Chest pain, unspecified: Secondary | ICD-10-CM | POA: Diagnosis not present

## 2023-11-12 LAB — CBC WITH DIFFERENTIAL/PLATELET
Abs Immature Granulocytes: 0.02 10*3/uL (ref 0.00–0.07)
Basophils Absolute: 0.1 10*3/uL (ref 0.0–0.1)
Basophils Relative: 1 %
Eosinophils Absolute: 0.2 10*3/uL (ref 0.0–0.5)
Eosinophils Relative: 3 %
HCT: 39.5 % (ref 39.0–52.0)
Hemoglobin: 13.1 g/dL (ref 13.0–17.0)
Immature Granulocytes: 0 %
Lymphocytes Relative: 33 %
Lymphs Abs: 2.2 10*3/uL (ref 0.7–4.0)
MCH: 30.8 pg (ref 26.0–34.0)
MCHC: 33.2 g/dL (ref 30.0–36.0)
MCV: 92.9 fL (ref 80.0–100.0)
Monocytes Absolute: 0.7 10*3/uL (ref 0.1–1.0)
Monocytes Relative: 10 %
Neutro Abs: 3.5 10*3/uL (ref 1.7–7.7)
Neutrophils Relative %: 53 %
Platelets: 229 10*3/uL (ref 150–400)
RBC: 4.25 MIL/uL (ref 4.22–5.81)
RDW: 13 % (ref 11.5–15.5)
WBC: 6.6 10*3/uL (ref 4.0–10.5)
nRBC: 0 % (ref 0.0–0.2)

## 2023-11-12 LAB — MAGNESIUM: Magnesium: 2 mg/dL (ref 1.7–2.4)

## 2023-11-12 LAB — COMPREHENSIVE METABOLIC PANEL
ALT: 16 U/L (ref 0–44)
AST: 16 U/L (ref 15–41)
Albumin: 3.4 g/dL — ABNORMAL LOW (ref 3.5–5.0)
Alkaline Phosphatase: 59 U/L (ref 38–126)
Anion gap: 5 (ref 5–15)
BUN: 20 mg/dL (ref 8–23)
CO2: 26 mmol/L (ref 22–32)
Calcium: 8.5 mg/dL — ABNORMAL LOW (ref 8.9–10.3)
Chloride: 100 mmol/L (ref 98–111)
Creatinine, Ser: 0.99 mg/dL (ref 0.61–1.24)
GFR, Estimated: >60 mL/min (ref >60)
Glucose, Bld: 94 mg/dL (ref 70–99)
Potassium: 3.8 mmol/L (ref 3.5–5.1)
Sodium: 131 mmol/L — ABNORMAL LOW (ref 135–145)
Total Bilirubin: 0.7 mg/dL (ref <1.2)
Total Protein: 6.7 g/dL (ref 6.5–8.1)

## 2023-11-12 MED ORDER — AMLODIPINE BESYLATE 5 MG PO TABS: 5.0000 mg | ORAL_TABLET | Freq: Every day | ORAL | 2 refills | Status: DC

## 2023-11-12 NOTE — Plan of Care (Signed)

## 2023-11-12 NOTE — Plan of Care (Signed)

## 2023-11-12 NOTE — Discharge Instructions (Addendum)
Please take all prescribed medications exactly as instructed including adding the blood pressure medication amlodipine 5 mg by mouth once daily to your current medications for better management of your blood pressure Please abstain from further illicit substances including cocaine use Please consume a low-sodium, low-fat diet Please increase your physical activity as tolerated. Please maintain all outpatient follow-up appointments including follow-up with your primary care provider.  You have additionally been made a follow-up appointment with cardiology on 12/19 at 2:45 PM. Please return to the emergency department if you develop worsening chest pain, shortness of breath fevers in excess of 100.4 F, weakness or inability to tolerate oral intake.

## 2023-11-12 NOTE — TOC Progression Note (Signed)
Transition of Care [facility]) - Progression Note    Patient Details  Name: Lance Walters MRN: [id number] Date of Birth: 23-Mar-1956  [facility] CM/SW Contact  Georgie Chard, LCSW Phone Number: 11/12/2023, 1:17 PM  Clinical Narrative:    CSW has given the patient bus pass. Nurse informed. AT this time there are no further TOC needs.   Expected Discharge Plan: Home/Self Care Barriers to Discharge: Continued Medical Work up  Expected Discharge Plan and Services       Living arrangements for the past 2 months: Apartment Expected Discharge Date: 11/12/23                                     Social Determinants of Health (SDOH) Interventions SDOH Screenings   Food Insecurity: Food Insecurity Present (11/11/2023)  Transportation Needs: Unmet Transportation Needs (11/11/2023)  Social Connections: Unknown (05/11/2022)   Received from [facility]  Tobacco Use: High Risk (11/10/2023)    Readmission Risk Interventions     No data to display

## 2023-11-15 LAB — CULTURE, BLOOD (ROUTINE X 2)
Culture: NO GROWTH
Culture: NO GROWTH
Special Requests: ADEQUATE
Special Requests: ADEQUATE

## 2023-11-17 ENCOUNTER — Emergency Department (HOSPITAL_COMMUNITY): Payer: Medicare Other

## 2023-11-17 ENCOUNTER — Emergency Department (HOSPITAL_COMMUNITY)
Admission: EM | Admit: 2023-11-17 | Discharge: 2023-11-17 | Disposition: A | Discharge status: Home / Self Care | Payer: Medicare Other

## 2023-11-17 ENCOUNTER — Encounter (HOSPITAL_COMMUNITY): Payer: Self-pay | Admitting: Emergency Medicine

## 2023-11-17 DIAGNOSIS — R0789 Other chest pain: Secondary | ICD-10-CM | POA: Diagnosis present

## 2023-11-17 DIAGNOSIS — I110 Hypertensive heart disease with heart failure: Secondary | ICD-10-CM | POA: Insufficient documentation

## 2023-11-17 DIAGNOSIS — Z79899 Other long term (current) drug therapy: Secondary | ICD-10-CM | POA: Insufficient documentation

## 2023-11-17 DIAGNOSIS — I509 Heart failure, unspecified: Secondary | ICD-10-CM | POA: Diagnosis not present

## 2023-11-17 DIAGNOSIS — I10 Essential (primary) hypertension: Secondary | ICD-10-CM | POA: Diagnosis not present

## 2023-11-17 DIAGNOSIS — R079 Chest pain, unspecified: Secondary | ICD-10-CM

## 2023-11-17 LAB — CBC
HCT: 37.4 % — ABNORMAL LOW (ref 39.0–52.0)
Hemoglobin: 12.3 g/dL — ABNORMAL LOW (ref 13.0–17.0)
MCH: 29.7 pg (ref 26.0–34.0)
MCHC: 32.9 g/dL (ref 30.0–36.0)
MCV: 90.3 fL (ref 80.0–100.0)
Platelets: 204 10*3/uL (ref 150–400)
RBC: 4.14 MIL/uL — ABNORMAL LOW (ref 4.22–5.81)
RDW: 12.7 % (ref 11.5–15.5)
WBC: 7 10*3/uL (ref 4.0–10.5)
nRBC: 0 % (ref 0.0–0.2)

## 2023-11-17 LAB — BASIC METABOLIC PANEL
Anion gap: 7 (ref 5–15)
BUN: 20 mg/dL (ref 8–23)
CO2: 22 mmol/L (ref 22–32)
Calcium: 8.6 mg/dL — ABNORMAL LOW (ref 8.9–10.3)
Chloride: 109 mmol/L (ref 98–111)
Creatinine, Ser: 1.3 mg/dL — ABNORMAL HIGH (ref 0.61–1.24)
GFR, Estimated: >60 mL/min (ref >60)
Glucose, Bld: 86 mg/dL (ref 70–99)
Potassium: 4 mmol/L (ref 3.5–5.1)
Sodium: 138 mmol/L (ref 135–145)

## 2023-11-17 LAB — TROPONIN I (HIGH SENSITIVITY)
Troponin I (High Sensitivity): 4 ng/L (ref <18)
Troponin I (High Sensitivity): 4 ng/L (ref <18)

## 2023-11-17 LAB — BRAIN NATRIURETIC PEPTIDE: B Natriuretic Peptide: 12.7 pg/mL (ref 0.0–100.0)

## 2023-11-17 NOTE — ED Provider Notes (Addendum)
EMERGENCY DEPARTMENT AT [facility] Provider Note   CSN: [id number] Arrival date & time: 11/17/23  1452     History  Chief Complaint  Patient presents with   Chest Pain    Lance Walters is a 67 y.o. male.   Chest Pain Patient is a 67 year old male with a PMH of HTN, seizure disorder, neuropathy, cocaine use who presented to the ED for chest pain.  Patient reports inserted around 1 PM today and has been intermittent since then.  Reports it feels like his typical chest pain, sharp in the center of his chest with radiation to his left and right sides.  States it is worse after walking around and improves with rest.  Denies diaphoresis, vomiting.  Denies history of MI but reports had an abnormal echocardiogram recently.  Denies shortness of breath, fevers, cough, congestion.  Denies seeing blood thinners or history of PE or DVT.  Denies using cocaine today.     Home Medications Prior to Admission medications   Medication Sig Start Date End Date Taking? Authorizing Provider  acetaminophen (TYLENOL) 500 MG tablet Take 500 mg by mouth every 8 (eight) hours as needed for moderate pain.    [provider]  amantadine (SYMMETREL) 100 MG capsule Take 100 mg by mouth 2 (two) times daily.    [provider]  amLODipine (NORVASC) 5 MG tablet Take 1 tablet (5 mg total) by mouth daily. 11/13/23   Shalhoub, Deno Lunger, MD  AMOXICILLIN PO Take 1 tablet by mouth 2 (two) times daily as needed (for UTI- see notes).    [provider]  aspirin EC 325 MG tablet Take 325-1,300 mg by mouth daily as needed (for headaches).    [provider]  cyclobenzaprine (FLEXERIL) 10 MG tablet Take 10 mg by mouth daily.    [provider]  FLUoxetine (PROZAC) 20 MG capsule Take 20 mg by mouth every evening.    [provider]  gabapentin (NEURONTIN) 100 MG capsule Take 100 mg by mouth 3 (three) times daily.    [provider]   levETIRAcetam (KEPPRA) 500 MG tablet Take 500 mg by mouth every 12 (twelve) hours.    [provider]  meloxicam (MOBIC) 7.5 MG tablet Take 7.5 mg by mouth daily as needed for pain.    [provider]  PEPTO-BISMOL 262 MG/15ML suspension Take 30 mLs by mouth every 6 (six) hours as needed for indigestion.    [provider]  tamsulosin (FLOMAX) 0.4 MG CAPS capsule Take 0.4 mg by mouth daily after supper. 09/18/20   [provider]      Allergies    Patient has no known allergies.    Review of Systems   Review of Systems  Cardiovascular:  Positive for chest pain.    Physical Exam Updated Vital Signs BP (!) 139/109   Pulse 62   Temp 98 F (36.7 C) (Oral)   Resp 17   SpO2 100%  Physical Exam Constitutional:      Appearance: He is well-developed.  Eyes:     Extraocular Movements: Extraocular movements intact.     Pupils: Pupils are equal, round, and reactive to light.  Cardiovascular:     Rate and Rhythm: Normal rate and regular rhythm.     Heart sounds: Normal heart sounds. No murmur heard.    No friction rub. No gallop.  Pulmonary:     Effort: Pulmonary effort is normal.     Breath sounds: Normal  breath sounds. No wheezing, rhonchi or rales.  Abdominal:     Palpations: Abdomen is soft.     Tenderness: There is no abdominal tenderness. There is no guarding or rebound.  Musculoskeletal:        General: Normal range of motion.     Cervical back: Normal range of motion and neck supple.     Right lower leg: No edema.     Left lower leg: No edema.  Skin:    General: Skin is warm and dry.     Capillary Refill: Capillary refill takes less than 2 seconds.  Neurological:     General: No focal deficit present.     Mental Status: He is alert.     Comments: Cranial nerves II-XII intact.  Strength symmetric and sensation intact in all 4 extremities     ED Results / Procedures / Treatments   Labs (all labs ordered are listed, but only abnormal  results are displayed) Labs Reviewed  BASIC METABOLIC PANEL - Abnormal; Notable for the following components:      Result Value   Creatinine, Ser 1.30 (*)    Calcium 8.6 (*)    All other components within normal limits  CBC - Abnormal; Notable for the following components:   RBC 4.14 (*)    Hemoglobin 12.3 (*)    HCT 37.4 (*)    All other components within normal limits  BRAIN NATRIURETIC PEPTIDE  TROPONIN I (HIGH SENSITIVITY)  TROPONIN I (HIGH SENSITIVITY)    EKG None  Radiology DG Chest 2 View  Result Date: 11/17/2023 CLINICAL DATA:  Chest pain. EXAM: CHEST - 2 VIEW COMPARISON:  November 10, 2023. FINDINGS: The heart size and mediastinal contours are within normal limits. Both lungs are clear. The visualized skeletal structures are unremarkable. IMPRESSION: No active cardiopulmonary disease. Electronically Signed   By: Lupita Raider M.D.   On: 11/17/2023 17:54    Procedures Procedures    Medications Ordered in ED Medications - No data to display  ED Course/ Medical Decision Making/ A&P                                 Medical Decision Making Amount and/or Complexity of Data Reviewed Labs: ordered. Radiology: ordered.   CBC with no leukocytosis or anemia.  Metabolic panel with no gross metabolic or electrolyte abnormality.  Creatinine mildly elevated at 1.3.  BNP 12.7.  Troponin 4, unchanged on repeat.  Chest x-ray negative for acute cardiopulmonary abnormality.  Additionally, patient had CTA PE performed 1 week prior which was negative for PE.  I have extremely low suspicion for PE and repeat imaging was not obtained in the ED as he is not tachycardic, hypoxic, or has signs or symptoms concerning for PE.  EKG appears improved from prior with resolution of lateral T wave inversions.  Chest pain is likely related to patient's chronic episodes of atypical chest pain.  I also have low suspicion for ACS, aortic dissection, pneumonia, pneumothorax as these are not consistent  with history, physical exam, and results of workup.  Additionally, patient had an echocardiogram 1 week prior, I feel admission for chest pain would be of low yield.  Results of laboratory and imaging evaluations were discussed with the patient.  Discussed using all medications as previously prescribed.  Encouraged patient to follow-up with his PCP.  Strict return precautions were discussed, and the patient voiced understanding.  Patient was discharged  in stable condition.        Final Clinical Impression(s) / ED Diagnoses Final diagnoses:  Chest pain, unspecified type    Rx / DC Orders ED Discharge Orders     None         Janyth Pupa, MD 11/17/23 2045    Janyth Pupa, MD 11/17/23 2045    Durwin Glaze, MD 11/18/23 [phone]

## 2023-11-17 NOTE — Discharge Instructions (Addendum)
You were seen here today for chest pain.  Your workup showed no abnormal electrolytes, cell counts, troponins, and x-ray of your heart and lungs.  Please use all medications as prescribed.  Please follow-up with your PCP and cardiologist.  Please return to the emergency department for worsening chest pain, shortness of breath, severe vomiting or inability to keep down food, loss of consciousness, or any worsening symptom or concern.

## 2023-11-17 NOTE — ED Triage Notes (Signed)
Pt here from [facility] with c/o chest pain , pain has been ongoing for weeks but worse to day after walking ,

## 2023-11-22 DIAGNOSIS — F17210 Nicotine dependence, cigarettes, uncomplicated: Secondary | ICD-10-CM

## 2023-11-22 NOTE — Assessment & Plan Note (Signed)
Counseling daily on cessation Ongoing cocaine use in the outpatient setting is likely contributing to the patient's chest discomfort.

## 2023-11-22 NOTE — Assessment & Plan Note (Signed)
.   Patient is being counseled daily on smoking cessation. . Providing patient with nicotine replacement therapy during this hospitalization.   

## 2023-11-22 NOTE — Assessment & Plan Note (Signed)
·   Replacing with potassium chloride °· Evaluating for concurrent hypomagnesemia  °· Monitoring potassium levels with serial chemistries. ° °

## 2023-11-22 NOTE — Discharge Summary (Signed)
Physician Discharge Summary   Patient: Lance Walters MRN: [id number] DOB: 1956-07-05  Admit date:     11/10/2023  Discharge date: 11/12/2023  Discharge Physician: Marinda Elk   PCP: Karenann Cai, NP   Recommendations at discharge:   Please take all prescribed medications exactly as instructed including adding the blood pressure medication amlodipine 5 mg by mouth once daily to your current medications for better management of your blood pressure Please abstain from further illicit substances including cocaine use Please consume a low-sodium, low-fat diet Please increase your physical activity as tolerated. Please maintain all outpatient follow-up appointments including follow-up with your primary care provider.  You have additionally been made a follow-up appointment with cardiology on 12/19 at 2:45 PM. Please return to the emergency department if you develop worsening chest pain, shortness of breath fevers in excess of 100.4 F, weakness or inability to tolerate oral intake.  Discharge Diagnoses: Principal Problem:   Chest pain Active Problems:   Cocaine use   Essential hypertension   Hypokalemia   Abnormal finding on echocardiogram   Grade I diastolic dysfunction   Mixed hyperlipidemia   Nicotine dependence, cigarettes, uncomplicated  Resolved Problems:   * No resolved hospital problems. *   Hospital Course: 67 y.o. male with past medical history of gastroesophageal reflux disease, hypertension, hyperlipidemia, PTSD, seizure disorder, substance abuse who presented to [facility] emergency department at the direction of his cardiologist due to bouts of chest discomfort and an abnormal echocardiogram.  His echocardiogram showed a large, obstructing left atrial mass and was told to come to the emergency department.  Emergency department course was unremarkable.  Case was discussed with cardiology and the hospitalist group was then consulted to  assess the patient for admission to the hospital.  Cardiology consultation with Dr. Odis Hollingshead was additionally obtained.  An echocardiogram was obtained during this hospitalization which did not confirm any of the findings feared in the outpatient setting.  Blood cultures were obtained in case of the possibility of infective endocarditis which remain negative throughout the hospitalization.  Patient underwent cardiac enzymes which were unremarkable as well as telemetry monitoring which was additionally unremarkable.  While patient did complain of some chest discomfort throughout the hospitalization this was felt to be atypical and not secondary to plaque rupture.  Concerning patient's ongoing cocaine abuse patient was counseled on cessation throughout the hospitalization.  Patient was monitored throughout the hospitalization with cultures revealing no growth.  With the advisement of cardiology patient was discharged home on 11/12/2023 in improved and stable condition and arrangements were made for the patient to follow closely in the outpatient setting for continued monitoring and evaluation.       Pain control - [employer] Controlled Substance Reporting System database was reviewed. and patient was instructed, not to drive, operate heavy machinery, perform activities at heights, swimming or participation in water activities or provide baby-sitting services while on Pain, Sleep and Anxiety Medications; until their outpatient Physician has advised to do so again. Also recommended to not to take more than prescribed Pain, Sleep and Anxiety Medications.   Consultants: Dr. Odis Hollingshead with Cardiology Procedures performed: None Disposition: Home Diet recommendation:  Discharge Diet Orders (From admission, onward)     Start     Ordered   11/12/23 0000  Diet - low sodium heart healthy        11/12/23 1255           Cardiac diet  DISCHARGE MEDICATION: Allergies as of 11/12/2023  No Known  Allergies      Medication List     STOP taking these medications    doxycycline 100 MG tablet Commonly known as: VIBRA-TABS   prazosin 1 MG capsule Commonly known as: MINIPRESS       TAKE these medications    acetaminophen 500 MG tablet Commonly known as: TYLENOL Take 500 mg by mouth every 8 (eight) hours as needed for moderate pain.   amantadine 100 MG capsule Commonly known as: SYMMETREL Take 100 mg by mouth 2 (two) times daily.   amLODipine 5 MG tablet Commonly known as: NORVASC Take 1 tablet (5 mg total) by mouth daily.   AMOXICILLIN PO Take 1 tablet by mouth 2 (two) times daily as needed (for UTI- see notes).   aspirin EC 325 MG tablet Take 325-1,300 mg by mouth daily as needed (for headaches).   cyclobenzaprine 10 MG tablet Commonly known as: FLEXERIL Take 10 mg by mouth daily. What changed: Another medication with the same name was removed. Continue taking this medication, and follow the directions you see here.   FLUoxetine 20 MG capsule Commonly known as: PROZAC Take 20 mg by mouth every evening.   gabapentin 100 MG capsule Commonly known as: NEURONTIN Take 100 mg by mouth 3 (three) times daily.   levETIRAcetam 500 MG tablet Commonly known as: KEPPRA Take 500 mg by mouth every 12 (twelve) hours.   meloxicam 7.5 MG tablet Commonly known as: MOBIC Take 7.5 mg by mouth daily as needed for pain.   Pepto-Bismol 262 MG/15ML suspension Generic drug: bismuth subsalicylate Take 30 mLs by mouth every 6 (six) hours as needed for indigestion.   tamsulosin 0.4 MG Caps capsule Commonly known as: FLOMAX Take 0.4 mg by mouth daily after supper.        Follow-up Information     Sharlene Dory, PA-C Follow up on 12/15/2023.   Specialty: Cardiology Why: 2:45PM. Cardiology follow up Contact information: [address] [place] [address] [phone]         Karenann Cai, NP. Schedule an appointment as soon as possible for a  visit in 1 week(s).   Specialty: Nurse Practitioner Contact information: [address] [place] [address] [phone]                 Discharge Exam: Ceasar Mons Weights   11/10/23 1018  Weight: 78.5 kg    Constitutional: Awake alert and oriented x3, no associated distress.   Respiratory: clear to auscultation bilaterally, no wheezing, no crackles. Normal respiratory effort. No accessory muscle use.  Cardiovascular: Regular rate and rhythm, no murmurs / rubs / gallops. No extremity edema. 2+ pedal pulses. No carotid bruits.  Abdomen: Abdomen is soft and nontender.  No evidence of intra-abdominal masses.  Positive bowel sounds noted in all quadrants.   Musculoskeletal: No joint deformity upper and lower extremities. Good ROM, no contractures. Normal muscle tone.     Condition at discharge: fair  The results of significant diagnostics from this hospitalization (including imaging, microbiology, ancillary and laboratory) are listed below for reference.   Imaging Studies: DG Chest 2 View  Result Date: 11/17/2023 CLINICAL DATA:  Chest pain. EXAM: CHEST - 2 VIEW COMPARISON:  November 10, 2023. FINDINGS: The heart size and mediastinal contours are within normal limits. Both lungs are clear. The visualized skeletal structures are unremarkable. IMPRESSION: No active cardiopulmonary disease. Electronically Signed   By: Lupita Raider M.D.   On: 11/17/2023 17:54   CT  Angio Chest Pulmonary Embolism (PE) W or WO Contrast  Result Date: 11/11/2023 CLINICAL DATA:  Intermediate probability PE.  Abnormal D-dimer. EXAM: CT ANGIOGRAPHY CHEST WITH CONTRAST TECHNIQUE: Multidetector CT imaging of the chest was performed using the standard protocol during bolus administration of intravenous contrast. Multiplanar CT image reconstructions and MIPs were obtained to evaluate the vascular anatomy. RADIATION DOSE REDUCTION: This exam was performed according to the departmental dose-optimization program  which includes automated exposure control, adjustment of the mA and/or kV according to patient size and/or use of iterative reconstruction technique. CONTRAST:  75mL OMNIPAQUE IOHEXOL 350 MG/ML SOLN COMPARISON:  None Available. FINDINGS: Cardiovascular: Satisfactory opacification of the pulmonary arteries to the segmental level. No evidence of pulmonary embolism. Normal heart size. No pericardial effusion. Mediastinum/Nodes: No enlarged mediastinal, hilar, or axillary lymph nodes. Thyroid gland, trachea, and esophagus demonstrate no significant findings. Lungs/Pleura: Lungs are clear. No pleural effusion or pneumothorax. Upper Abdomen: No acute abnormality.  4 cm cyst right kidney. Musculoskeletal: No chest wall abnormality. No acute or significant osseous findings. Review of the MIP images confirms the above findings. IMPRESSION: 1. No evidence of PE or acute chest finding. 2. Right renal cyst. Electronically Signed   By: Layla Maw M.D.   On: 11/11/2023 18:42   ECHOCARDIOGRAM COMPLETE  Result Date: 11/10/2023    ECHOCARDIOGRAM REPORT   Patient Name:   Lance Walters Date of Exam: 11/10/2023 Medical Rec #:  [id number]                 Height:       69.0 in Accession #:    [id number]                Weight:       173.0 lb Date of Birth:  [birth year]                BSA:          1.943 m Patient Age:    66 years                  BP:           165/103 mmHg Patient Gender: M                         HR:           63 bpm. Exam Location:  Inpatient Procedure: 2D Echo, Cardiac Doppler and Color Doppler Indications:    Chest Pain  History:        Patient has no prior history of Echocardiogram examinations.                 Risk Factors:Hypertension and Dyslipidemia.  Sonographer:    Lucendia Herrlich RCS Referring Phys: [id number] [employer] COUNTRYMAN IMPRESSIONS  1. 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  2. Left ventricular ejection fraction, by estimation, is 60 to 65%. The left ventricle has normal function.  The left ventricle has no regional wall motion abnormalities. Left ventricular diastolic parameters are consistent with Grade I diastolic dysfunction (impaired relaxation).  3. Right ventricular systolic function is normal. The right ventricular size is normal. There is normal pulmonary artery systolic pressure.  4. The mitral valve is normal in structure. Mild mitral valve regurgitation. No evidence of mitral stenosis.  5. The aortic valve is tricuspid. Aortic valve regurgitation is not visualized. No aortic stenosis is present.  6. The inferior vena cava is dilated in size with >50% respiratory  variability, suggesting right atrial pressure of 8 mmHg. FINDINGS  Left Ventricle: Left ventricular ejection fraction, by estimation, is 60 to 65%. The left ventricle has normal function. The left ventricle has no regional wall motion abnormalities. The left ventricular internal cavity size was normal in size. There is  no left ventricular hypertrophy. Left ventricular diastolic parameters are consistent with Grade I diastolic dysfunction (impaired relaxation). Normal left ventricular filling pressure. Right Ventricle: The right ventricular size is normal. No increase in right ventricular wall thickness. Right ventricular systolic function is normal. There is normal pulmonary artery systolic pressure. The tricuspid regurgitant velocity is 1.80 m/s, and  with an assumed right atrial pressure of 8 mmHg, the estimated right ventricular systolic pressure is 21.0 mmHg. Left Atrium: Left atrial size was normal in size. Right Atrium: Right atrial size was normal in size. Prominent Chiari network. Pericardium: There is no evidence of pericardial effusion. Mitral Valve: The mitral valve is normal in structure. Mild mitral valve regurgitation. No evidence of mitral valve stenosis. Tricuspid Valve: The tricuspid valve is normal in structure. Tricuspid valve regurgitation is mild . No evidence of tricuspid stenosis. Aortic Valve: The  aortic valve is tricuspid. Aortic valve regurgitation is not visualized. No aortic stenosis is present. Aortic valve peak gradient measures 3.9 mmHg. Pulmonic Valve: The pulmonic valve was normal in structure. Pulmonic valve regurgitation is trivial. No evidence of pulmonic stenosis. Aorta: The aortic root is normal in size and structure. Venous: The inferior vena cava is dilated in size with greater than 50% respiratory variability, suggesting right atrial pressure of 8 mmHg. IAS/Shunts: No atrial level shunt detected by color flow Doppler.  LEFT VENTRICLE PLAX 2D LVIDd:         4.30 cm   Diastology LVIDs:         2.30 cm   LV e' medial:    5.77 cm/s LV PW:         1.00 cm   LV E/e' medial:  7.6 LV IVS:        0.90 cm   LV e' lateral:   7.83 cm/s LVOT diam:     2.40 cm   LV E/e' lateral: 5.6 LV SV:         76 LV SV Index:   39 LVOT Area:     4.52 cm  RIGHT VENTRICLE             IVC RV S prime:     14.10 cm/s  IVC diam: 2.50 cm TAPSE (M-mode): 2.2 cm LEFT ATRIUM             Index        RIGHT ATRIUM           Index LA diam:        3.40 cm 1.75 cm/m   RA Area:     13.10 cm LA Vol (A2C):   50.4 ml 25.95 ml/m  RA Volume:   29.00 ml  14.93 ml/m LA Vol (A4C):   40.3 ml 20.75 ml/m LA Biplane Vol: 47.6 ml 24.50 ml/m  AORTIC VALVE AV Area (Vmax): 3.71 cm AV Vmax:        98.80 cm/s AV Peak Grad:   3.9 mmHg LVOT Vmax:      80.97 cm/s LVOT Vmean:     54.450 cm/s LVOT VTI:       0.168 m  AORTA Ao Root diam: 3.30 cm Ao Asc diam:  3.80 cm MITRAL VALVE  TRICUSPID VALVE MV Area (PHT): 2.54 cm    TR Peak grad:   13.0 mmHg MV Decel Time: 299 msec    TR Vmax:        180.00 cm/s MV E velocity: 43.70 cm/s MV A velocity: 57.80 cm/s  SHUNTS MV E/A ratio:  0.76        Systemic VTI:  0.17 m                            Systemic Diam: 2.40 cm Chilton Si MD Electronically signed by Chilton Si MD Signature Date/Time: 11/10/2023/3:09:50 PM    Final    DG Chest Portable 1 View  Result Date:  11/10/2023 CLINICAL DATA:  Chest pain. EXAM: PORTABLE CHEST 1 VIEW COMPARISON:  11/07/2013. FINDINGS: Low lung volume. Bilateral lung fields are clear. Bilateral costophrenic angles are clear. Normal cardio-mediastinal silhouette. No acute osseous abnormalities. The soft tissues are within normal limits. IMPRESSION: *No active disease. Electronically Signed   By: Jules Schick M.D.   On: 11/10/2023 14:26    Microbiology: Results for orders placed or performed during the hospital encounter of 11/10/23  Culture, blood (Routine X 2) w Reflex to ID Panel     Status: None   Collection Time: 11/10/23  7:52 PM   Specimen: BLOOD  Result Value Ref Range Status   Specimen Description   Final    BLOOD BLOOD LEFT ARM Performed at [facility], [address] [place], [place] [address]    Special Requests   Final    BOTTLES DRAWN AEROBIC ONLY Blood Culture adequate volume Performed at [facility], [address] [place], [place] [address]    Culture   Final    NO GROWTH 5 DAYS Performed at [facility], [address], [place], [place] [address]    Report Status 11/15/2023 FINAL  Final  Culture, blood (Routine X 2) w Reflex to ID Panel     Status: None   Collection Time: 11/10/23  7:58 PM   Specimen: BLOOD  Result Value Ref Range Status   Specimen Description   Final    BLOOD BLOOD RIGHT HAND Performed at [facility], [address], [place], [place] [address]    Special Requests   Final    BOTTLES DRAWN AEROBIC AND ANAEROBIC Blood Culture adequate volume Performed at [facility], [address] [place], [place] [address]    Culture   Final    NO GROWTH 5 DAYS Performed at [facility], [address] [place], [place] [address]    Report Status 11/15/2023 FINAL  Final    Discharge time spent: greater than 30 minutes.  Signed: Marinda Elk, MD [facility]

## 2023-11-22 NOTE — Assessment & Plan Note (Signed)
  Titrate antihypertensive regimen as necessary to achieve adequate BP control PRN intravenous antihypertensives for excessively elevated blood pressure

## 2023-11-22 NOTE — Assessment & Plan Note (Signed)
On evaluation, chest discomfort is extremely atypical and reproducible on exam. Patient was informed that his echocardiogram in the outpatient setting revealed evidence of a possible mass however repeat echocardiogram here in the hospital does not confirm this finding. Monitoring patient on telemetry Cycling cardiac enzymes Evaluating patient for any evidence of infective endocarditis with blood cultures\ Case discussed with cardiology, will continue to monitor patient overnight and the cultures continue to remain negative tomorrow we will consider discharge.

## 2023-11-22 NOTE — Assessment & Plan Note (Signed)
Please see assessment and plan above

## 2023-12-05 ENCOUNTER — Telehealth: Payer: Self-pay | Admitting: Physician Assistant

## 2023-12-05 NOTE — Telephone Encounter (Signed)
Returned call to pt.  He is aware that we could only provide a letter stating that he has a hospital follow-up appointment 12/15/23, since he hasn't been seen here before.  Pt aware if he needs further documentation, he can speak with the provider at that time of the visit.  Pt verbalized understanding.

## 2023-12-05 NOTE — Telephone Encounter (Signed)
Spoke with patient and he states he will need a letter saying he will have to be at his appointment on 12/19 for his lawyer because he also have court that same day. He would like letter emailed to him.  [email or web address]

## 2023-12-05 NOTE — Telephone Encounter (Signed)
Pt needs document for the appointment on 12/19 for his lawyer for court. Please advise

## 2023-12-15 ENCOUNTER — Encounter (HOSPITAL_COMMUNITY): Payer: Self-pay

## 2023-12-15 ENCOUNTER — Ambulatory Visit: Payer: Medicare Other | Admitting: Physician Assistant

## 2023-12-15 ENCOUNTER — Emergency Department (HOSPITAL_COMMUNITY)
Admission: EM | Admit: 2023-12-15 | Discharge: 2023-12-15 | Disposition: A | Discharge status: Home / Self Care | Payer: Medicare Other | Attending: Emergency Medicine | Admitting: Emergency Medicine

## 2023-12-15 ENCOUNTER — Emergency Department (HOSPITAL_COMMUNITY): Payer: Medicare Other

## 2023-12-15 ENCOUNTER — Other Ambulatory Visit: Payer: Self-pay

## 2023-12-15 DIAGNOSIS — Z7982 Long term (current) use of aspirin: Secondary | ICD-10-CM | POA: Insufficient documentation

## 2023-12-15 DIAGNOSIS — F17210 Nicotine dependence, cigarettes, uncomplicated: Secondary | ICD-10-CM | POA: Insufficient documentation

## 2023-12-15 DIAGNOSIS — R0602 Shortness of breath: Secondary | ICD-10-CM | POA: Diagnosis not present

## 2023-12-15 DIAGNOSIS — I10 Essential (primary) hypertension: Secondary | ICD-10-CM | POA: Diagnosis not present

## 2023-12-15 DIAGNOSIS — R079 Chest pain, unspecified: Secondary | ICD-10-CM | POA: Diagnosis present

## 2023-12-15 DIAGNOSIS — Z79899 Other long term (current) drug therapy: Secondary | ICD-10-CM | POA: Diagnosis not present

## 2023-12-15 DIAGNOSIS — R0789 Other chest pain: Secondary | ICD-10-CM | POA: Diagnosis not present

## 2023-12-15 LAB — BASIC METABOLIC PANEL
Anion gap: 10 (ref 5–15)
BUN: 17 mg/dL (ref 8–23)
CO2: 21 mmol/L — ABNORMAL LOW (ref 22–32)
Calcium: 9.3 mg/dL (ref 8.9–10.3)
Chloride: 107 mmol/L (ref 98–111)
Creatinine, Ser: 1.14 mg/dL (ref 0.61–1.24)
GFR, Estimated: >60 mL/min (ref >60)
Glucose, Bld: 90 mg/dL (ref 70–99)
Potassium: 3.6 mmol/L (ref 3.5–5.1)
Sodium: 138 mmol/L (ref 135–145)

## 2023-12-15 LAB — BRAIN NATRIURETIC PEPTIDE: B Natriuretic Peptide: 25.4 pg/mL (ref 0.0–100.0)

## 2023-12-15 LAB — CBC
HCT: 39.8 % (ref 39.0–52.0)
Hemoglobin: 13.5 g/dL (ref 13.0–17.0)
MCH: 30.9 pg (ref 26.0–34.0)
MCHC: 33.9 g/dL (ref 30.0–36.0)
MCV: 91.1 fL (ref 80.0–100.0)
Platelets: 184 10*3/uL (ref 150–400)
RBC: 4.37 MIL/uL (ref 4.22–5.81)
RDW: 12.5 % (ref 11.5–15.5)
WBC: 9.3 10*3/uL (ref 4.0–10.5)
nRBC: 0 % (ref 0.0–0.2)

## 2023-12-15 LAB — TROPONIN I (HIGH SENSITIVITY)
Troponin I (High Sensitivity): 5 ng/L (ref <18)
Troponin I (High Sensitivity): 4 ng/L (ref <18)

## 2023-12-15 MED ORDER — NITROGLYCERIN 0.4 MG SL SUBL
0.4000 mg | SUBLINGUAL_TABLET | SUBLINGUAL | Status: DC | PRN
  Administered 2023-12-15: 0.4 mg via SUBLINGUAL
  Filled 2023-12-15: qty 1

## 2023-12-15 MED ORDER — ACETAMINOPHEN 500 MG PO TABS
1000.0000 mg | ORAL_TABLET | Freq: Once | ORAL | Status: AC
  Administered 2023-12-15: 1000 mg via ORAL
  Filled 2023-12-15: qty 2

## 2023-12-15 NOTE — ED Notes (Signed)
Patient stated his chest was hurting again. John PA notified. Patient reassessed in triage. Nitroglycerin ordered along with tylenol. New EKG ordered.

## 2023-12-15 NOTE — ED Provider Notes (Signed)
[place] EMERGENCY DEPARTMENT AT [facility] Provider Note  CSN: [id number] Arrival date & time: 12/15/23 0941  Chief Complaint(s) Chest Pain  HPI Lance Walters is a 67 y.o. male history of cocaine use hypertension, hyperlipidemia presenting to the emergency department chest pain.  Patient reports he has had chest pain on and off for about a month.  He was admitted to the hospital last month for possible abnormal echocardiogram however echocardiogram performed in the hospital was reassuring.  He reports that the pain comes and goes.  Sometimes he feels short of breath.  Pain is not pleuritic.  It is worse with movement.  No nausea or vomiting.  He reports occasionally he feels dizzy and lightheaded but no syncope.  No back pain.  No urinary symptoms, diarrhea, abdominal pain.   He reports that he was supposed to be sentenced in court today and requests a letter stating that he should not be in jail.  He is also supposed to follow-up with cardiology today however he reports his appointment was canceled by the cardiologist.  Past Medical History Past Medical History:  Diagnosis Date   Anxiety    Arthritis    Chronic neck and back pain    Depression    Epilepsy (HCC)    Frequent headaches    GERD (gastroesophageal reflux disease)    HTN (hypertension)    Hyperlipidemia    diet controlled, no medication   Osteoporosis    PTSD (post-traumatic stress disorder)    Seizures (HCC)    last one 2015, controlled with med    Substance abuse (HCC)    history cocaine use   Patient Active Problem List   Diagnosis Date Noted   Nicotine dependence, cigarettes, uncomplicated 11/22/2023   Mixed hyperlipidemia 11/11/2023   Chest pain 11/10/2023   Hypokalemia 11/10/2023   Abnormal finding on echocardiogram 11/10/2023   Grade I diastolic dysfunction 11/10/2023   Polyp of colon 03/12/2022   Diverticulosis 03/12/2022   Incontinence of feces 12/14/2021   Left lower  quadrant pain 12/14/2021   Pyuria 12/14/2021   Bright red stool 09/21/2021   Polyneuropathy, unspecified 02/04/2021   Abnormal renal function test 09/18/2020   Arthritis of right hip 09/18/2020   Neuropathy 09/18/2020   Spinal stenosis in cervical region 09/18/2020   Spinal stenosis of lumbosacral region 09/18/2020   Back pain 08/15/2020   Benign prostatic hyperplasia without lower urinary tract symptoms 08/15/2020   Cocaine use 08/15/2020   Essential hypertension 08/15/2020   Seizure disorder (HCC) 08/15/2020   Substance use disorder 08/15/2020   Home Medication(s) Prior to Admission medications   Medication Sig Start Date End Date Taking? Authorizing Provider  acetaminophen (TYLENOL) 500 MG tablet Take 500 mg by mouth every 8 (eight) hours as needed for moderate pain.    [provider]  amantadine (SYMMETREL) 100 MG capsule Take 100 mg by mouth 2 (two) times daily.    [provider]  amLODipine (NORVASC) 5 MG tablet Take 1 tablet (5 mg total) by mouth daily. 11/13/23   Shalhoub, Deno Lunger, MD  AMOXICILLIN PO Take 1 tablet by mouth 2 (two) times daily as needed (for UTI- see notes).    [provider]  aspirin EC 325 MG tablet Take 325-1,300 mg by mouth daily as needed (for headaches).    [provider]  cyclobenzaprine (FLEXERIL) 10 MG tablet Take 10 mg by mouth daily.    [provider]  FLUoxetine (PROZAC) 20 MG capsule Take 20 mg  by mouth every evening.    [provider]  gabapentin (NEURONTIN) 100 MG capsule Take 100 mg by mouth 3 (three) times daily.    [provider]  levETIRAcetam (KEPPRA) 500 MG tablet Take 500 mg by mouth every 12 (twelve) hours.    [provider]  meloxicam (MOBIC) 7.5 MG tablet Take 7.5 mg by mouth daily as needed for pain.    [provider]  PEPTO-BISMOL 262 MG/15ML suspension Take 30 mLs by mouth every 6 (six) hours as needed for indigestion.    [provider]   tamsulosin (FLOMAX) 0.4 MG CAPS capsule Take 0.4 mg by mouth daily after supper. 09/18/20   [provider]                                                                                                                                    Past Surgical History Past Surgical History:  Procedure Laterality Date   no prior surgery     Family History Family History  Problem Relation Age of Onset   Hypertension Mother    Diabetes Mother    Heart failure Mother    Heart disease Mother    Heart attack Father    Hypertension Father    Heart failure Father    Prostate cancer Father    Dementia Sister    Eczema Daughter    Lupus Daughter    Asthma Son    Asthma Son    Hypertension Son    Colon cancer Neg Hx    Colon polyps Neg Hx    Esophageal cancer Neg Hx    Rectal cancer Neg Hx    Stomach cancer Neg Hx     Social History Social History   Tobacco Use   Smoking status: Every Day    Current packs/day: 1.50    Average packs/day: 1.5 packs/day for 20.0 years (30.0 ttl pk-yrs)    Types: Cigarettes   Smokeless tobacco: Never  Substance Use Topics   Alcohol use: Yes    Alcohol/week: 2.0 standard drinks of alcohol    Types: 2 Cans of beer per week    Comment: occasional   Drug use: Yes    Types: Cocaine    Comment: 11/10/23   Allergies Patient has no known allergies.  Review of Systems Review of Systems  All other systems reviewed and are negative.   Physical Exam Vital Signs  I have reviewed the triage vital signs BP (!) 149/105 (BP Location: Left Arm)   Pulse 65   Temp 98.4 F (36.9 C) (Oral)   Resp 17   Ht 5\' 11"  (1.803 m)   Wt 73 kg   SpO2 96%   BMI 22.45 kg/m  Physical Exam Vitals and nursing note reviewed.  Constitutional:      General: He is not in acute distress.    Appearance: Normal appearance.  HENT:     Mouth/Throat:  Mouth: Mucous membranes are moist.  Eyes:     Conjunctiva/sclera: Conjunctivae normal.  Cardiovascular:      Rate and Rhythm: Normal rate and regular rhythm.     Pulses:          Radial pulses are 2+ on the right side and 2+ on the left side.  Pulmonary:     Effort: Pulmonary effort is normal. No respiratory distress.     Breath sounds: Normal breath sounds.  Abdominal:     General: Abdomen is flat.     Palpations: Abdomen is soft.     Tenderness: There is no abdominal tenderness.  Musculoskeletal:     Right lower leg: No edema.     Left lower leg: No edema.  Skin:    General: Skin is warm and dry.     Capillary Refill: Capillary refill takes less than 2 seconds.  Neurological:     Mental Status: He is alert and oriented to person, place, and time. Mental status is at baseline.  Psychiatric:        Mood and Affect: Mood normal.        Behavior: Behavior normal.     ED Results and Treatments Labs (all labs ordered are listed, but only abnormal results are displayed) Labs Reviewed  BASIC METABOLIC PANEL - Abnormal; Notable for the following components:      Result Value   CO2 21 (*)    All other components within normal limits  CBC  BRAIN NATRIURETIC PEPTIDE  TROPONIN I (HIGH SENSITIVITY)  TROPONIN I (HIGH SENSITIVITY)                                                                                                                          Radiology DG Chest 2 View Result Date: 12/15/2023 CLINICAL DATA:  Chest pain EXAM: CHEST - 2 VIEW COMPARISON:  11/17/2023 FINDINGS: Apical lordotic positioning. Midline trachea. Normal heart size. Tortuous thoracic aorta. No pleural effusion or pneumothorax. Clear lungs. IMPRESSION: No active cardiopulmonary disease. Electronically Signed   By: Jeronimo Greaves M.D.   On: 12/15/2023 11:30    Pertinent labs & imaging results that were available during my care of the patient were reviewed by me and considered in my medical decision making (see MDM for details).  Medications Ordered in ED Medications  nitroGLYCERIN (NITROSTAT) SL tablet 0.4 mg (0.4 mg  Sublingual Given 12/15/23 1333)  acetaminophen (TYLENOL) tablet 1,000 mg (1,000 mg Oral Given 12/15/23 1333)  Procedures Procedures  (including critical care time)  Medical Decision Making / ED Course   MDM:  67 year old male presenting to the emergency department with chest pain.  Patient well-appearing, physical examination unremarkable.  Vital signs are reassuring.  Low concern for dangerous process.  His EKG is reassuring.  He has had 2 negative troponins.  Low concern for ACS.  He does report it improved with nitroglycerin.  May have some anginal symptoms.  His chest pain is reproducible on palpation so may also be musculoskeletal.  Chest x-ray reassuring with no change from previous, equal pulses, doubt dissection or other dangerous process such as pneumothorax or pneumonia.  Doubt pulmonary embolism, no tachycardia or hypoxia.  Since patient was unable to see his cardiologist today, will place a further cardiology referral to help facilitate this. Will discharge patient to home. All questions answered. Patient comfortable with plan of discharge. Return precautions discussed with patient and specified on the after visit summary.   Clinical Course as of 12/15/23 1621  Thu Dec 15, 2023  1324 Patient awaiting to be seen in the lobby.  States that he is having worsening chest pain.  Also ports that he is scheduled for court appearance in about an hour.  Treated him with Tylenol and sublingual nitroglycerin.  Ordered another EKG. [JR]    Clinical Course User Index [JR] [name], PA-C     Additional history obtained: -Additional history obtained from ems -External records from outside source obtained and reviewed including: Chart review including previous notes, labs, imaging, consultation notes including previous hospitalization for same  pain   Lab Tests: -I ordered, reviewed, and interpreted labs.   The pertinent results include:   Labs Reviewed  BASIC METABOLIC PANEL - Abnormal; Notable for the following components:      Result Value   CO2 21 (*)    All other components within normal limits  CBC  BRAIN NATRIURETIC PEPTIDE  TROPONIN I (HIGH SENSITIVITY)  TROPONIN I (HIGH SENSITIVITY)    Notable for mild low nonspecific co2. Normal troponin  EKG   EKG Interpretation Date/Time:  Thursday December 15 2023 13:34:46 EST Ventricular Rate:  66 PR Interval:  191 QRS Duration:  75 QT Interval:  378 QTC Calculation: 396 R Axis:   -75  Text Interpretation: Sinus rhythm Left anterior fascicular block Consider right ventricular hypertrophy Confirmed by Linwood Dibbles [phone]) on 12/15/2023 1:37:24 PM         Imaging Studies ordered: I ordered imaging studies including CXR On my interpretation imaging demonstrates no acute process I independently visualized and interpreted imaging. I agree with the radiologist interpretation   Medicines ordered and prescription drug management: Meds ordered this encounter  Medications   acetaminophen (TYLENOL) tablet 1,000 mg   nitroGLYCERIN (NITROSTAT) SL tablet 0.4 mg    -I have reviewed the patients home medicines and have made adjustments as needed   Cardiac Monitoring: The patient was maintained on a cardiac monitor.  I personally viewed and interpreted the cardiac monitored which showed an underlying rhythm of: NSR  Social Determinants of Health:  Diagnosis or treatment significantly limited by social determinants of health: polysubstance abuse   Reevaluation: After the interventions noted above, I reevaluated the patient and found that their symptoms have improved  Co morbidities that complicate the patient evaluation  Past Medical History:  Diagnosis Date   Anxiety    Arthritis    Chronic neck and back pain    Depression    Epilepsy (HCC)  Frequent  headaches    GERD (gastroesophageal reflux disease)    HTN (hypertension)    Hyperlipidemia    diet controlled, no medication   Osteoporosis    PTSD (post-traumatic stress disorder)    Seizures (HCC)    last one 2015, controlled with med    Substance abuse (HCC)    history cocaine use      Dispostion: Disposition decision including need for hospitalization was considered, and patient discharged from emergency department.    Final Clinical Impression(s) / ED Diagnoses Final diagnoses:  Atypical chest pain     This chart was dictated using voice recognition software.  Despite best efforts to proofread,  errors can occur which can change the documentation meaning.    Lonell Grandchild, MD 12/15/23 [phone]

## 2023-12-15 NOTE — ED Provider Triage Note (Signed)
Emergency Medicine Provider Triage Evaluation Note  Lance Walters , a 67 y.o. male  was evaluated in triage.  Pt complains of chest pain.  States has had chest pain for several months.  Located in the left chest.  Also stating in the last couple days he has been more short of breath especially with exertion.  Mentioned that on a recent echo there is a mass in his right atrium.  Review of Systems  Positive: See above Negative: See above  Physical Exam  BP (!) 154/94   Pulse 68   Temp 98.2 F (36.8 C) (Oral)   Resp 16   Ht 5\' 11"  (1.803 m)   Wt 73 kg   SpO2 94%   BMI 22.45 kg/m  Gen:   Awake, no distress   Resp:  Normal effort  MSK:   Moves extremities without difficulty  Other:    Medical Decision Making  Medically screening exam initiated at 10:07 AM.  Appropriate orders placed.  Lance Walters was informed that the remainder of the evaluation will be completed by another provider, this initial triage assessment does not replace that evaluation, and the importance of remaining in the ED until their evaluation is complete.  Work up started   Lance Eagle, PA-C 12/15/23 1008

## 2023-12-15 NOTE — ED Triage Notes (Addendum)
Patient BIB [facility] from home. Complaining of right sided chest pain and mass on the right side of his atrium. Patient took 500mg  tylenol and 1 nitroglycerin before arriving with EMS. Feels short of breath with exertion and dizziness. Has court later today. Patient feels better after getting the nitroglycerin.

## 2023-12-15 NOTE — Discharge Instructions (Addendum)
We evaluated you for your chest pain.  Your testing was reassuring including your cardiac enzyme testing and EKG.  Please follow-up with your cardiologist.  We have placed a referral since you are unable to see them today.  Please return for any new or worsening symptoms.

## 2023-12-15 NOTE — ED Notes (Signed)
Pt stated they started feeling dizzy and right sided weakness along with chest tightness as if it was "stabbing" his heart. Triage RN aware and moved back to triage room for reassessment.

## 2024-01-30 ENCOUNTER — Emergency Department (HOSPITAL_COMMUNITY): Payer: Medicare Other

## 2024-01-30 ENCOUNTER — Emergency Department (HOSPITAL_COMMUNITY)
Admission: EM | Admit: 2024-01-30 | Discharge: 2024-01-31 | Disposition: A | Discharge status: Home / Self Care | Payer: Medicare Other | Attending: Emergency Medicine | Admitting: Emergency Medicine

## 2024-01-30 DIAGNOSIS — I10 Essential (primary) hypertension: Secondary | ICD-10-CM | POA: Insufficient documentation

## 2024-01-30 DIAGNOSIS — Z7982 Long term (current) use of aspirin: Secondary | ICD-10-CM | POA: Diagnosis not present

## 2024-01-30 DIAGNOSIS — R0789 Other chest pain: Secondary | ICD-10-CM | POA: Insufficient documentation

## 2024-01-30 DIAGNOSIS — F17210 Nicotine dependence, cigarettes, uncomplicated: Secondary | ICD-10-CM | POA: Diagnosis not present

## 2024-01-30 DIAGNOSIS — Z79899 Other long term (current) drug therapy: Secondary | ICD-10-CM | POA: Diagnosis not present

## 2024-01-30 DIAGNOSIS — R079 Chest pain, unspecified: Secondary | ICD-10-CM | POA: Diagnosis present

## 2024-01-30 LAB — COMPREHENSIVE METABOLIC PANEL
ALT: 12 U/L (ref 0–44)
AST: 18 U/L (ref 15–41)
Albumin: 3.9 g/dL (ref 3.5–5.0)
Alkaline Phosphatase: 56 U/L (ref 38–126)
Anion gap: 11 (ref 5–15)
BUN: 14 mg/dL (ref 8–23)
CO2: 23 mmol/L (ref 22–32)
Calcium: 8.7 mg/dL — ABNORMAL LOW (ref 8.9–10.3)
Chloride: 109 mmol/L (ref 98–111)
Creatinine, Ser: 1.19 mg/dL (ref 0.61–1.24)
GFR, Estimated: >60 mL/min (ref >60)
Glucose, Bld: 83 mg/dL (ref 70–99)
Potassium: 3.3 mmol/L — ABNORMAL LOW (ref 3.5–5.1)
Sodium: 143 mmol/L (ref 135–145)
Total Bilirubin: 0.9 mg/dL (ref 0.0–1.2)
Total Protein: 7 g/dL (ref 6.5–8.1)

## 2024-01-30 LAB — TROPONIN I (HIGH SENSITIVITY): Troponin I (High Sensitivity): 5 ng/L (ref <18)

## 2024-01-30 LAB — CBC WITH DIFFERENTIAL/PLATELET
Abs Immature Granulocytes: 0.02 10*3/uL (ref 0.00–0.07)
Basophils Absolute: 0 10*3/uL (ref 0.0–0.1)
Basophils Relative: 1 %
Eosinophils Absolute: 0.2 10*3/uL (ref 0.0–0.5)
Eosinophils Relative: 3 %
HCT: 41.7 % (ref 39.0–52.0)
Hemoglobin: 13.3 g/dL (ref 13.0–17.0)
Immature Granulocytes: 0 %
Lymphocytes Relative: 38 %
Lymphs Abs: 2.7 10*3/uL (ref 0.7–4.0)
MCH: 29.9 pg (ref 26.0–34.0)
MCHC: 31.9 g/dL (ref 30.0–36.0)
MCV: 93.7 fL (ref 80.0–100.0)
Monocytes Absolute: 0.7 10*3/uL (ref 0.1–1.0)
Monocytes Relative: 10 %
Neutro Abs: 3.4 10*3/uL (ref 1.7–7.7)
Neutrophils Relative %: 48 %
Platelets: 193 10*3/uL (ref 150–400)
RBC: 4.45 MIL/uL (ref 4.22–5.81)
RDW: 12.4 % (ref 11.5–15.5)
WBC: 7.1 10*3/uL (ref 4.0–10.5)
nRBC: 0 % (ref 0.0–0.2)

## 2024-01-30 NOTE — ED Provider Triage Note (Signed)
Emergency Medicine Provider Triage Evaluation Note  Lorie Melichar , a 68 y.o. male  was evaluated in triage.  Pt complains of chest pain.  Past history of cardiac abnormalities but he is unable to tell exactly what these cardiac findings are.  On chart review, has history of grade 1 diastolic dysfunction, as well as prior cocaine use.  Seizure disorder also seen.  He reports the chest pain was brought on today due to increased stress.  He states that this is due to life stressors.  Patient is currently in police custody.  Denies any recent substance use.  Review of Systems  Positive: As above Negative: As above  Physical Exam  BP 113/89   Pulse 68   Temp 98.4 F (36.9 C) (Oral)   Resp 18   SpO2 99%  Gen:   Awake, no distress  Resp:  Normal effort  MSK:   Moves extremities without difficulty  Other:    Medical Decision Making  Medically screening exam initiated at 9:22 PM.  Appropriate orders placed.  Gilles Trimpe was informed that the remainder of the evaluation will be completed by another provider, this initial triage assessment does not replace that evaluation, and the importance of remaining in the ED until their evaluation is complete.     Smitty Knudsen, PA-C 01/30/24 2122

## 2024-01-30 NOTE — ED Triage Notes (Signed)
Pt reports x 6 months has had chest pain but is worse today because he is under stress, pt in police custody.

## 2024-01-31 DIAGNOSIS — R0789 Other chest pain: Secondary | ICD-10-CM | POA: Diagnosis not present

## 2024-01-31 LAB — TROPONIN I (HIGH SENSITIVITY): Troponin I (High Sensitivity): 3 ng/L (ref <18)

## 2024-01-31 NOTE — ED Notes (Signed)
Pt sleep, did not want to wake for vital retake.

## 2024-01-31 NOTE — ED Provider Notes (Signed)
 [facility] AT [facility] Provider Note  CSN: [id number] Arrival date & time: 01/30/24 2050  Chief Complaint(s) Chest Pain  HPI Lance Walters is a 68 y.o. male history of hypertension, hyperlipidemia presenting to the emergency department with chest pain.  He reports that he was just arrested and reports subsequently developed chest pain.  This began yesterday.  Reports increased anxiety.  Denies any other symptoms like nausea or vomiting, shortness of breath, leg swelling, fevers or chills, cough, runny nose, sore throat, back pain.  Pain not pleuritic.  Not worse with movement.  No other symptoms.   Past Medical History Past Medical History:  Diagnosis Date   Anxiety    Arthritis    Chronic neck and back pain    Depression    Epilepsy (HCC)    Frequent headaches    GERD (gastroesophageal reflux disease)    HTN (hypertension)    Hyperlipidemia    diet controlled, no medication   Osteoporosis    PTSD (post-traumatic stress disorder)    Seizures (HCC)    last one 2015, controlled with med    Substance abuse (HCC)    history cocaine use   Patient Active Problem List   Diagnosis Date Noted   Nicotine  dependence, cigarettes, uncomplicated 11/22/2023   Mixed hyperlipidemia 11/11/2023   Chest pain 11/10/2023   Hypokalemia 11/10/2023   Abnormal finding on echocardiogram 11/10/2023   Grade I diastolic dysfunction 11/10/2023   Polyp of colon 03/12/2022   Diverticulosis 03/12/2022   Incontinence of feces 12/14/2021   Left lower quadrant pain 12/14/2021   Pyuria 12/14/2021   Bright red stool 09/21/2021   Polyneuropathy, unspecified 02/04/2021   Abnormal renal function test 09/18/2020   Arthritis of right hip 09/18/2020   Neuropathy 09/18/2020   Spinal stenosis in cervical region 09/18/2020   Spinal stenosis of lumbosacral region 09/18/2020   Back pain 08/15/2020   Benign prostatic hyperplasia without lower urinary tract symptoms  08/15/2020   Cocaine use 08/15/2020   Essential hypertension 08/15/2020   Seizure disorder (HCC) 08/15/2020   Substance use disorder 08/15/2020   Home Medication(s) Prior to Admission medications   Medication Sig Start Date End Date Taking? Authorizing Provider  acetaminophen  (TYLENOL ) 500 MG tablet Take 500 mg by mouth every 8 (eight) hours as needed for moderate pain.    [provider]  amantadine (SYMMETREL) 100 MG capsule Take 100 mg by mouth 2 (two) times daily.    [provider]  amLODipine  (NORVASC ) 5 MG tablet Take 1 tablet (5 mg total) by mouth daily. 11/13/23   Shalhoub, Zachary PARAS, MD  AMOXICILLIN PO Take 1 tablet by mouth 2 (two) times daily as needed (for UTI- see notes).    [provider]  aspirin EC 325 MG tablet Take 325-1,300 mg by mouth daily as needed (for headaches).    [provider]  cyclobenzaprine  (FLEXERIL ) 10 MG tablet Take 10 mg by mouth daily.    [provider]  FLUoxetine  (PROZAC ) 20 MG capsule Take 20 mg by mouth every evening.    [provider]  gabapentin  (NEURONTIN ) 100 MG capsule Take 100 mg by mouth 3 (three) times daily.    [provider]  levETIRAcetam  (KEPPRA ) 500 MG tablet Take 500 mg by mouth every 12 (twelve) hours.    [provider]  meloxicam (MOBIC) 7.5 MG tablet Take 7.5 mg by mouth daily as needed for pain.    [provider]  PEPTO-BISMOL 262 MG/15ML  suspension Take 30 mLs by mouth every 6 (six) hours as needed for indigestion.    [provider]  tamsulosin  (FLOMAX ) 0.4 MG CAPS capsule Take 0.4 mg by mouth daily after supper. 09/18/20   [provider]                                                                                                                                    Past Surgical History Past Surgical History:  Procedure Laterality Date   no prior surgery     Family History Family History  Problem Relation Age of Onset    Hypertension Mother    Diabetes Mother    Heart failure Mother    Heart disease Mother    Heart attack Father    Hypertension Father    Heart failure Father    Prostate cancer Father    Dementia Sister    Eczema Daughter    Lupus Daughter    Asthma Son    Asthma Son    Hypertension Son    Colon cancer Neg Hx    Colon polyps Neg Hx    Esophageal cancer Neg Hx    Rectal cancer Neg Hx    Stomach cancer Neg Hx     Social History Social History   Tobacco Use   Smoking status: Every Day    Current packs/day: 1.50    Average packs/day: 1.5 packs/day for 20.0 years (30.0 ttl pk-yrs)    Types: Cigarettes   Smokeless tobacco: Never  Substance Use Topics   Alcohol use: Yes    Alcohol/week: 2.0 standard drinks of alcohol    Types: 2 Cans of beer per week    Comment: occasional   Drug use: Yes    Types: Cocaine    Comment: 11/10/23   Allergies Patient has no known allergies.  Review of Systems Review of Systems  All other systems reviewed and are negative.   Physical Exam Vital Signs  I have reviewed the triage vital signs BP (!) 137/103 (BP Location: Right Arm)   Pulse (!) 56   Temp 97.9 F (36.6 C) (Oral)   Resp 17   SpO2 100%  Physical Exam Vitals and nursing note reviewed.  Constitutional:      General: He is not in acute distress.    Appearance: Normal appearance.  HENT:     Mouth/Throat:     Mouth: Mucous membranes are moist.  Eyes:     Conjunctiva/sclera: Conjunctivae normal.  Cardiovascular:     Rate and Rhythm: Normal rate and regular rhythm.     Pulses:          Radial pulses are 2+ on the right side and 2+ on the left side.  Pulmonary:     Effort: Pulmonary effort is normal. No respiratory distress.     Breath sounds: Normal breath sounds.  Abdominal:     General: Abdomen is flat.  Palpations: Abdomen is soft.     Tenderness: There is no abdominal tenderness.  Musculoskeletal:     Right lower leg: No edema.     Left lower leg: No edema.   Skin:    General: Skin is warm and dry.     Capillary Refill: Capillary refill takes less than 2 seconds.  Neurological:     Mental Status: He is alert and oriented to person, place, and time. Mental status is at baseline.  Psychiatric:        Mood and Affect: Mood normal.        Behavior: Behavior normal.     ED Results and Treatments Labs (all labs ordered are listed, but only abnormal results are displayed) Labs Reviewed  COMPREHENSIVE METABOLIC PANEL - Abnormal; Notable for the following components:      Result Value   Potassium 3.3 (*)    Calcium 8.7 (*)    All other components within normal limits  CBC WITH DIFFERENTIAL/PLATELET  TROPONIN I (HIGH SENSITIVITY)  TROPONIN I (HIGH SENSITIVITY)                                                                                                                          Radiology DG Chest Portable 1 View Result Date: 01/30/2024 CLINICAL DATA:  Chest pain Pt reports 6 months of chest pain but is worse today because he is under stress. EXAM: PORTABLE CHEST 1 VIEW COMPARISON:  Chest x-ray 12/15/2023. FINDINGS: The heart and mediastinal contours are unchanged. Atherosclerotic plaque. No focal consolidation. No pulmonary edema. No pleural effusion. No pneumothorax. No acute osseous abnormality.  Old left rib fractures. IMPRESSION: 1. No active disease. 2.  Aortic Atherosclerosis (ICD10-I70.0). Electronically Signed   By: Morgane  Naveau M.D.   On: 01/30/2024 21:44    Pertinent labs & imaging results that were available during my care of the patient were reviewed by me and considered in my medical decision making (see MDM for details).  Medications Ordered in ED Medications - No data to display                                                                                                                                   Procedures Procedures  (including critical care time)  Medical Decision Making / ED Course   MDM:  68 year old  presenting to the emergency department chest pain.  Patient well-appearing, physical exam  unremarkable.  ECG with some nonspecific T wave changes which do not seem significantly different from previous.  No STEMI.  Unclear cause of chest pain.  Low concern for dangerous cause.  Low concern for ACS with 2 negative troponin, atypical symptoms.  Doubt pulmonary embolism without shortness of breath, tachycardia, hypoxia.  Chest x-ray without evidence of pneumonia, pneumothorax.  Doubt dissection, equal pulses, stable cardiomediastinal silhouette.  Doubt esophageal pathology with no vomiting.  Patient does report he has follow-up with cardiologist next month.  Do not think patient needs to be admitted for further workup of chest pain at this time. Will discharge patient to home. All questions answered. Patient comfortable with plan of discharge. Return precautions discussed with patient and specified on the after visit summary.       Additional history obtained:  -External records from outside source obtained and reviewed including: Chart review including previous notes, labs, imaging, consultation notes including prior ER visit    Lab Tests: -I ordered, reviewed, and interpreted labs.   The pertinent results include:   Labs Reviewed  COMPREHENSIVE METABOLIC PANEL - Abnormal; Notable for the following components:      Result Value   Potassium 3.3 (*)    Calcium 8.7 (*)    All other components within normal limits  CBC WITH DIFFERENTIAL/PLATELET  TROPONIN I (HIGH SENSITIVITY)  TROPONIN I (HIGH SENSITIVITY)    Notable for negative troponin  EKG   EKG Interpretation Date/Time:  Monday January 30 2024 21:19:15 EST Ventricular Rate:  59 PR Interval:  196 QRS Duration:  73 QT Interval:  468 QTC Calculation: 464 R Axis:   -67  Text Interpretation: Sinus rhythm Left anterior fascicular block Probable anteroseptal infarct, old Borderline T abnormalities, inferior leads When compared with  ECG of 12/15/2023, No significant change was found Confirmed by Raford Lenis ([id number]) on 01/31/2024 1:02:26 AM         Imaging Studies ordered: I ordered imaging studies including CXR On my interpretation imaging demonstrates no acute process I independently visualized and interpreted imaging. I agree with the radiologist interpretation   Medicines ordered and prescription drug management: No orders of the defined types were placed in this encounter.   -I have reviewed the patients home medicines and have made adjustments as needed     Co morbidities that complicate the patient evaluation  Past Medical History:  Diagnosis Date   Anxiety    Arthritis    Chronic neck and back pain    Depression    Epilepsy (HCC)    Frequent headaches    GERD (gastroesophageal reflux disease)    HTN (hypertension)    Hyperlipidemia    diet controlled, no medication   Osteoporosis    PTSD (post-traumatic stress disorder)    Seizures (HCC)    last one 2015, controlled with med    Substance abuse (HCC)    history cocaine use      Dispostion: Disposition decision including need for hospitalization was considered, and patient discharged from emergency department.    Final Clinical Impression(s) / ED Diagnoses Final diagnoses:  Atypical chest pain     This chart was dictated using voice recognition software.  Despite best efforts to proofread,  errors can occur which can change the documentation meaning.    Francesca Elsie CROME, MD 01/31/24 [phone]

## 2024-02-20 ENCOUNTER — Telehealth: Payer: Self-pay | Admitting: Physician Assistant

## 2024-02-20 NOTE — Telephone Encounter (Signed)
 Spoke with [facility] and they would like to know if patient needs appointment on 03/01/24  The appointment was made before he was incarcerated. She states if its a regular OV he will need to reschedule.

## 2024-02-20 NOTE — Telephone Encounter (Signed)
 [facility] is calling because the patient has an appt for March 6th, 2025. The [facility] stated the patient made this appointment prior to arriving to the [facility]. They would like to know if the patient needs to be seen at this appointment or if he can see one of the providers at the [facility]. The [facility] stated the patient mentioned needing to be seen by [place] for a procedure that is needed. Please advise.

## 2024-02-22 NOTE — Telephone Encounter (Signed)
 Spoke with [facility] [employer] and she states patient will need to be rescheduled because his appointment was made before he came to the [facility] and they are booked the first weeks in March. Patient has been rescheduled.

## 2024-03-01 ENCOUNTER — Ambulatory Visit: Payer: Medicare Other | Admitting: Physician Assistant

## 2024-03-19 NOTE — Progress Notes (Deleted)
  Cardiology Office Note:  .   Date:  03/19/2024  ID:  Lance Walters, DOB 03-31-1956, MRN [id number] PCP: Hillery Aldo, NP  [facility] Providers Cardiologist:  None {  History of Present Illness: .   Lance Walters is a 68 y.o. male with a past medical history of hypertension, hyperlipidemia presenting for follow-up appointment.  Was last seen in the ER for chest pain back in February 2025.  He reported that he was just arrested and subsequently developed chest pain.  This began the day prior and also was having increased anxiety.  Did not have any other symptoms like nausea or vomiting, shortness of breath, leg swelling, fevers or chills, cough, runny nose, sore throat, or back pain.  Pain is not pleuritic.  Not worse with movement.  No other symptoms.  Other medical history includes anxiety, chronic neck and back pain, frequent headaches, GERD, hypertension, hyperlipidemia, osteoporosis, PTSD, seizures, and substance abuse (cocaine).  Patient had an EKG with some nonspecific T wave changes which do not seem significantly different from previous.  No STEMI.  Unclear cause of chest pain but low concern for dangerous cause.  2 negative troponins.  Atypical symptoms.  Patient was not admitted at that time.  Today, he***  ROS: Pertinent ROS in HPI  Studies Reviewed: .        *** Risk Assessment/Calculations:   {Does this patient have ATRIAL FIBRILLATION?:[phone]} No BP recorded.  {Refresh Note OR Click here to enter BP  :1}***       Physical Exam:   VS:  There were no vitals taken for this visit.   Wt Readings from Last 3 Encounters:  12/15/23 161 lb (73 kg)  11/10/23 173 lb (78.5 kg)  07/19/23 173 lb 1 oz (78.5 kg)    GEN: Well nourished, well developed in no acute distress NECK: No JVD; No carotid bruits CARDIAC: ***RRR, no murmurs, rubs, gallops RESPIRATORY:  Clear to auscultation without rales, wheezing or rhonchi  ABDOMEN: Soft, non-tender,  non-distended EXTREMITIES:  No edema; No deformity   ASSESSMENT AND PLAN: .   Chest pain (atypical)  Hypertension Hyperlipidemia Substance abuse GERD    {Are you ordering a CV Procedure (e.g. stress test, cath, DCCV, TEE, etc)?   Press F2        :[id number]}  Dispo: ***  Signed, Sharlene Dory, PA-C

## 2024-03-20 ENCOUNTER — Ambulatory Visit: Payer: Medicare Other | Attending: Physician Assistant | Admitting: Physician Assistant

## 2024-03-20 DIAGNOSIS — E782 Mixed hyperlipidemia: Secondary | ICD-10-CM

## 2024-03-20 DIAGNOSIS — F1990 Other psychoactive substance use, unspecified, uncomplicated: Secondary | ICD-10-CM

## 2024-03-20 DIAGNOSIS — I10 Essential (primary) hypertension: Secondary | ICD-10-CM

## 2024-03-20 DIAGNOSIS — K219 Gastro-esophageal reflux disease without esophagitis: Secondary | ICD-10-CM

## 2024-05-17 ENCOUNTER — Encounter: Payer: Self-pay | Admitting: Emergency Medicine

## 2024-11-29 ENCOUNTER — Ambulatory Visit (HOSPITAL_COMMUNITY)
Admission: EM | Admit: 2024-11-29 | Discharge: 2024-11-30 | Disposition: A | Discharge status: Skilled Nursing Facility | Attending: Psychiatry | Admitting: Psychiatry

## 2024-11-29 DIAGNOSIS — Z79899 Other long term (current) drug therapy: Secondary | ICD-10-CM | POA: Diagnosis not present

## 2024-11-29 DIAGNOSIS — Z5901 Sheltered homelessness: Secondary | ICD-10-CM | POA: Diagnosis not present

## 2024-11-29 DIAGNOSIS — I444 Left anterior fascicular block: Secondary | ICD-10-CM | POA: Diagnosis not present

## 2024-11-29 DIAGNOSIS — F1290 Cannabis use, unspecified, uncomplicated: Secondary | ICD-10-CM | POA: Diagnosis not present

## 2024-11-29 DIAGNOSIS — F419 Anxiety disorder, unspecified: Secondary | ICD-10-CM | POA: Diagnosis not present

## 2024-11-29 DIAGNOSIS — F4310 Post-traumatic stress disorder, unspecified: Secondary | ICD-10-CM | POA: Diagnosis not present

## 2024-11-29 DIAGNOSIS — F1910 Other psychoactive substance abuse, uncomplicated: Secondary | ICD-10-CM | POA: Diagnosis not present

## 2024-11-29 LAB — POCT URINE DRUG SCREEN - MANUAL ENTRY (I-SCREEN)
POC Amphetamine UR: NOT DETECTED
POC Buprenorphine (BUP): NOT DETECTED
POC Cocaine UR: POSITIVE — AB
POC Marijuana UR: POSITIVE — AB
POC Methadone UR: NOT DETECTED
POC Methamphetamine UR: NOT DETECTED
POC Morphine: NOT DETECTED
POC Oxazepam (BZO): NOT DETECTED
POC Oxycodone UR: NOT DETECTED
POC Secobarbital (BAR): NOT DETECTED

## 2024-11-29 MED ORDER — MAGNESIUM HYDROXIDE 400 MG/5ML PO SUSP: 30.0000 mL | Freq: Every day | ORAL | Status: DC | PRN

## 2024-11-29 MED ORDER — TRAZODONE HCL 50 MG PO TABS
50.0000 mg | ORAL_TABLET | Freq: Every evening | ORAL | Status: DC | PRN
  Administered 2024-11-30: 50 mg via ORAL
  Filled 2024-11-29: qty 1

## 2024-11-29 MED ORDER — ACETAMINOPHEN 325 MG PO TABS: 650.0000 mg | ORAL_TABLET | Freq: Four times a day (QID) | ORAL | Status: DC | PRN

## 2024-11-29 MED ORDER — OLANZAPINE 5 MG PO TBDP: 5.0000 mg | ORAL_TABLET | Freq: Three times a day (TID) | ORAL | Status: DC | PRN

## 2024-11-29 MED ORDER — ALUM & MAG HYDROXIDE-SIMETH 200-200-20 MG/5ML PO SUSP: 30.0000 mL | ORAL | Status: DC | PRN

## 2024-11-29 NOTE — Progress Notes (Signed)
   11/29/24 2057  [facility] Triage Screening (Walk-ins at [facility] only)  How Did You Hear About Us ? Self  What Is the Reason for Your Visit/Call Today? Pt presents to [facility] as a voluntary walk-in, unaccompanied requesting substance abuse treatment. Pt reports using crack (unknown amount) within the last 24 hours. Pt was released from prison in late October and reports that he resided in a few shelters and he was unable to stay clean. Pt reports using crack daily for about a month. Pt reports psychiatric diagnosis of PTSD, GAD and adjustment disorder. Pt is established with [facility] for medical and behavioral health needs. Pt currently denies SI,HI,AVH.  How Long Has This Been Causing You Problems? 1-6 months  Have You Recently Had Any Thoughts About Hurting Yourself? No  Are You Planning to Commit Suicide/Harm Yourself At This time? No  Have you Recently Had Thoughts About Hurting Someone Sherral? No  Are You Planning To Harm Someone At This Time? No  Physical Abuse Denies  Verbal Abuse Denies  Sexual Abuse Denies  Exploitation of patient/patient's resources Denies  Self-Neglect Denies  Are you currently experiencing any auditory, visual or other hallucinations? No  Have You Used Any Alcohol or Drugs in the Past 24 Hours? Yes  What Did You Use and How Much? Crack (uncertain of amount used)  Do you have any current medical co-morbidities that require immediate attention? No  Clinician description of patient physical appearance/behavior: Calm, cooperative, pleasant  What Do You Feel Would Help You the Most Today? Alcohol or Drug Use Treatment  If access to [facility] Urgent [facility] was not available, would you have sought care in the Emergency Department? No  Determination of Need Urgent (48 hours)  Options For Referral Other: Comment;Facility-Based Crisis;Outpatient Therapy;Chemical Dependency Intensive Outpatient Therapy (CDIOP)  Determination of Need filed? Yes

## 2024-11-29 NOTE — BH Assessment (Incomplete)
 Comprehensive Clinical Assessment (CCA) Note  11/29/2024 Lance Walters [id number]  Chief Complaint:  Chief Complaint  Patient presents with  . Drug Problem  Disposition: Per Lance Walters patient is recommended for overnight observation with re-evaluation in the morning.    The patient demonstrates the following risk factors for suicide: Chronic risk factors for suicide include: psychiatric disorder of depression,anxiety and substance use disorder. Acute risk factors for suicide include: {Acute Risk Factors for Dlprpiz:69585987}. Protective factors for this patient include: {Protective Factors for Suicide Mpdx:69585986}. Considering these factors, the overall suicide risk at this point appears to be {Desc; low/moderate/high:110033}. Patient {ACTION; IS/IS WNU:78978602} appropriate for outpatient follow up.   Patient is a *** year old {Desc; male/male:11659} with a history of {Diagnoses; anxiety/depression:15361} who presents {Voluntary?:60373} to [facility] for an assessment. Patient states he is homeless and resides at the [facility]. Patient reports isolation, crying spells, irritability, hopelessness, guilt, loss of interest to do things they enjoy, fatigue, lack of concentration, worthlessness, change in sleep, change in appetite. Patient reports history of past suicide attempts, last occurrence was ***.  Patient has a hx of Substance Abuse:  Last use was *** .Patient {Actions; denies-reports:120008} NSSIB, SI, HI, AVH.  Patient identifies {his/her/their:21314} primary stressors as ***. Patient reports a family hx of ***{Diagnoses; anxiety/depression:15361}. Patient {Actions; denies-reports:120008} history of abuse or trauma. Patient {Actions; denies-reports:120008} current legal problems. Patient {ACTION; IS/IS WNU:78978602} receiving outpatient psychiatry services, with [facility]. Patient reports {he/she/they:23295} takes  {his/her/their:21314} medications as prescribed (see MAR) and {Actions; denies-reports:120008} recent medication changes. Patient {Actions; denies-reports:120008} previous inpatient admission at ***for *** in ***.  Patient {Actions; denies-reports:120008} access to weapons.   Patient is {ACTIONS; CAN/NOT:18746} to contract for safety outside of the hospital.  Patient gives verbal consent for [facility] to contact ***.  Per {FAMILY FZFAZMD:80002} patient is ***.   Treatment options were discussed and patient is in agreement with recommendation for ***.   During evaluation *** is in no acute distress. {He/she (caps):30048} is alert, oriented x 4, calm, cooperative and attentive. {his/her/their:21314} mood is {THERAPIES; PSYCH MOOD/AFFECT:23994} with congruent affect. {He/she (caps):30048} has normal speech, and behavior.  Objectively there is no evidence of psychosis/mania or delusional thinking.  Patient is able to converse coherently, goal directed thoughts, no distractibility, or pre-occupation.   {He/she (caps):30048} also denies suicidal/self-harm/homicidal ideation, psychosis, and paranoia.  Patient answered question appropriately.      Visit Diagnosis: ***    CCA Screening, Triage and Referral (STR)  Patient Reported Information How did you hear about us ? Self  What Is the Reason for Your Visit/Call Today? Pt presents to [facility] as a voluntary walk-in, unaccompanied requesting substance abuse treatment. Pt reports using crack (unknown amount) within the last 24 hours. Pt was released from prison in late October and reports that he resided in a few shelters and he was unable to stay clean. Pt reports using crack daily for about a month. Pt reports psychiatric diagnosis of PTSD, GAD and adjustment disorder. Pt is established with [facility] for medical and behavioral health needs. Pt currently denies SI,HI,AVH.  How Long Has This Been Causing You Problems? 1-6 months  What Do You Feel  Would Help You the Most Today? Alcohol or Drug Use Treatment   Have You Recently Had Any Thoughts About Hurting Yourself? No  Are You Planning to Commit Suicide/Harm Yourself At This time? No   Flowsheet Row ED from 11/29/2024 in [facility] ED from 12/15/2023 in  [place] [facility] ED from 11/17/2023 in [facility] Department at [facility]  C-SSRS RISK CATEGORY No Risk No Risk No Risk    Have you Recently Had Thoughts About Hurting Someone [place]? No  Are You Planning to Harm Someone at This Time? No  Explanation: No data recorded  Have You Used Any Alcohol or Drugs in the Past 24 Hours? Yes  How Long Ago Did You Use Drugs or Alcohol? No data recorded What Did You Use and How Much? Crack (uncertain of amount used)   Do You Currently Have a Therapist/Psychiatrist? No data recorded Name of Therapist/Psychiatrist:    Have You Been Recently Discharged From Any Office Practice or Programs? No data recorded Explanation of Discharge From Practice/Program: No data recorded    CCA Screening Triage Referral Assessment Type of Contact: No data recorded Telemedicine Service Delivery:   Is this Initial or Reassessment?   Date Telepsych consult ordered in [facility]:    Time Telepsych consult ordered in [facility]:    Location of Assessment: No data recorded Provider Location: No data recorded  Collateral Involvement: No data recorded  Does Patient Have a Court Appointed Legal Guardian? No data recorded Legal Guardian Contact Information: No data recorded Copy of Legal Guardianship Form: No data recorded Legal Guardian Notified of Arrival: No data recorded Legal Guardian Notified of Pending Discharge: No data recorded If Minor and Not Living with Parent(s), Who has Custody? No data recorded Is CPS involved or ever been involved? No data recorded Is APS involved or ever been involved? No data recorded  Patient  Determined To Be At Risk for Harm To Self or Others Based on Review of Patient Reported Information or Presenting Complaint? No data recorded Method: No data recorded Availability of Means: No data recorded Intent: No data recorded Notification Required: No data recorded Additional Information for Danger to Others Potential: No data recorded Additional Comments for Danger to Others Potential: No data recorded Are There Guns or Other Weapons in Your Home? No data recorded Types of Guns/Weapons: No data recorded Are These Weapons Safely Secured?                            No data recorded Who Could Verify You Are Able To Have These Secured: No data recorded Do You Have any Outstanding Charges, Pending Court Dates, Parole/Probation? No data recorded Contacted To Inform of Risk of Harm To Self or Others: No data recorded   Does Patient Present under Involuntary Commitment? No data recorded   [place] of Residence: No data recorded  Patient Currently Receiving the Following Services: No data recorded  Determination of Need: Urgent (48 hours)   Options For Referral: Other: Comment; Facility-Based Crisis; Outpatient Therapy; Chemical Dependency Intensive Outpatient Therapy (CDIOP)     CCA Biopsychosocial Patient Reported Schizophrenia/Schizoaffective Diagnosis in Past: No data recorded  Strengths: No data recorded  Mental Health Symptoms Depression:  No data recorded  Duration of Depressive symptoms:    Mania:  No data recorded  Anxiety:   No data recorded  Psychosis:  No data recorded  Duration of Psychotic symptoms:    Trauma:  No data recorded  Obsessions:  No data recorded  Compulsions:  No data recorded  Inattention:  No data recorded  Hyperactivity/Impulsivity:  No data recorded  Oppositional/Defiant Behaviors:  No data recorded  Emotional Irregularity:  No data recorded  Other Mood/Personality Symptoms:  No data  recorded   Mental Status Exam Appearance and self-care   Stature:  No data recorded  Weight:  No data recorded  Clothing:  No data recorded  Grooming:  No data recorded  Cosmetic use:  No data recorded  Posture/gait:  No data recorded  Motor activity:  No data recorded  Sensorium  Attention:  No data recorded  Concentration:  No data recorded  Orientation:  No data recorded  Recall/memory:  No data recorded  Affect and Mood  Affect:  No data recorded  Mood:  No data recorded  Relating  Eye contact:  No data recorded  Facial expression:  No data recorded  Attitude toward examiner:  No data recorded  Thought and Language  Speech flow: No data recorded  Thought content:  No data recorded  Preoccupation:  No data recorded  Hallucinations:  No data recorded  Organization:  No data recorded  [employer] of Knowledge:  No data recorded  Intelligence:  No data recorded  Abstraction:  No data recorded  Judgement:  No data recorded  Reality Testing:  No data recorded  Insight:  No data recorded  Decision Making:  No data recorded  Social Functioning  Social Maturity:  No data recorded  Social Judgement:  No data recorded  Stress  Stressors:  No data recorded  Coping Ability:  No data recorded  Skill Deficits:  No data recorded  Supports:  No data recorded    Religion:    Leisure/Recreation:    Exercise/Diet:     CCA Employment/Education Employment/Work Situation:    Education:     CCA Family/Childhood History Family and Relationship History:    Childhood History:          CCA Substance Use Alcohol/Drug Use:                           ASAM's:  Six Dimensions of Multidimensional Assessment  Dimension 1:  Acute Intoxication and/or Withdrawal Potential:      Dimension 2:  Biomedical Conditions and Complications:      Dimension 3:  Emotional, Behavioral, or Cognitive Conditions and Complications:     Dimension 4:  Readiness to Change:     Dimension 5:  Relapse, Continued use,  or Continued Problem Potential:     Dimension 6:  Recovery/Living Environment:     ASAM Severity Score:    ASAM Recommended Level of Treatment:     Substance use Disorder (SUD)    Recommendations for Services/Supports/Treatments:    Disposition Recommendation per psychiatric provider: {CHLmaccldispo:31820}   DSM5 Diagnoses: Patient Active Problem List   Diagnosis Date Noted  . Nicotine  dependence, cigarettes, uncomplicated 11/22/2023  . Mixed hyperlipidemia 11/11/2023  . Chest pain 11/10/2023  . Hypokalemia 11/10/2023  . Abnormal finding on echocardiogram 11/10/2023  . Grade I diastolic dysfunction 11/10/2023  . Polyp of colon 03/12/2022  . Diverticulosis 03/12/2022  . Incontinence of feces 12/14/2021  . Left lower quadrant pain 12/14/2021  . Pyuria 12/14/2021  . Bright red stool 09/21/2021  . Polyneuropathy, unspecified 02/04/2021  . Abnormal renal function test 09/18/2020  . Arthritis of right hip 09/18/2020  . Neuropathy 09/18/2020  . Spinal stenosis in cervical region 09/18/2020  . Spinal stenosis of lumbosacral region 09/18/2020  . Back pain 08/15/2020  . Benign prostatic hyperplasia without lower urinary tract symptoms 08/15/2020  . Cocaine use 08/15/2020  . Essential hypertension 08/15/2020  . Seizure disorder (HCC) 08/15/2020  . Substance use  disorder 08/15/2020     Referrals to Alternative Service(s): Referred to Alternative Service(s):   Place:   Date:   Time:    Referred to Alternative Service(s):   Place:   Date:   Time:    Referred to Alternative Service(s):   Place:   Date:   Time:    Referred to Alternative Service(s):   Place:   Date:   Time:     Tonyetta Berko C Wilkins Elpers, LCMHCA

## 2024-11-29 NOTE — ED Provider Notes (Incomplete)
 [facility] Continuous Assessment Admission H&P  Date: 11/29/24 Patient Name: Olajuwon Fosdick MRN: [id number] Chief Complaint: I need to detox  Diagnoses:  Final diagnoses:  Polysubstance abuse (HCC)    HPI: ***  Total Time spent with patient: {Time; 15 min - 8 hours:17441}  Musculoskeletal  Strength & Muscle Tone: {desc; muscle tone:32375} Gait & Station: {PE GAIT ED WJUO:77474} Patient leans: {Patient Leans:21022755}  Psychiatric Specialty Exam  Presentation General Appearance:  Disheveled; Casual  Eye Contact: Fair  Speech: Pressured  Speech Volume: Normal  Handedness: Right   Mood and Affect  Mood: Anxious  Affect: Congruent   Thought Process  Thought Processes: Coherent  Descriptions of Associations:Intact  Orientation:Full (Time, Place and Person)  Thought Content:WDL    Hallucinations:Hallucinations: None  Ideas of Reference:None  Suicidal Thoughts:Suicidal Thoughts: No  Homicidal Thoughts:Homicidal Thoughts: No   Sensorium  Memory: Immediate Fair; Recent Fair; Remote Fair  Judgment: Fair  Insight: Fair   Chartered Certified Accountant: Fair  Attention Span: Fair  Recall: [employer] of Knowledge: Fair  Language: Fair   Psychomotor Activity  Psychomotor Activity: Psychomotor Activity: Restlessness   Assets  Assets: Communication Skills; Desire for Improvement; Resilience   Sleep  Sleep: Sleep: Poor Number of Hours of Sleep: 2   Nutritional Assessment (For OBS and FBC admissions only) Has the patient had a decrease in food intake/or appetite?: No Does the patient have dental problems?: No Does the patient have eating habits or behaviors that may be indicators of an eating disorder including binging or inducing vomiting?: No Has the patient recently lost weight without trying?: 0 Has the patient been eating poorly because of a decreased appetite?: 0 Malnutrition Screening Tool  Score: 0    Physical Exam ROS  Blood pressure (!) 119/90, pulse 98, temperature 98.5 F (36.9 C), temperature source Oral, resp. rate 20, SpO2 98%. There is no height or weight on file to calculate BMI.  Past Psychiatric History: ***   Is the patient at risk to self? {YES/NO:21197} Has the patient been a risk to self in the past 6 months? {YES/NO:21197}.    Has the patient been a risk to self within the distant past? {YES/NO:21197}  Is the patient a risk to others? {YES/NO:21197}  Has the patient been a risk to others in the past 6 months? {YES/NO:21197}  Has the patient been a risk to others within the distant past? {YES/NO:21197}  Past Medical History: ***  Family History: ***  Social History: ***  Last Labs:  No visits with results within 6 Month(s) from this visit.  Latest known visit with results is:  Admission on 12/15/2023, Discharged on 12/15/2023  Component Date Value Ref Range Status  . Sodium 12/15/2023 138  135 - 145 mmol/L Final  . Potassium 12/15/2023 3.6  3.5 - 5.1 mmol/L Final  . Chloride 12/15/2023 107  98 - 111 mmol/L Final  . CO2 12/15/2023 21 (L)  22 - 32 mmol/L Final  . Glucose, Bld 12/15/2023 90  70 - 99 mg/dL Final   Glucose reference range applies only to samples taken after fasting for at least 8 hours.  . BUN 12/15/2023 17  8 - 23 mg/dL Final  . Creatinine, Ser 12/15/2023 1.14  0.61 - 1.24 mg/dL Final  . Calcium 87/80/7975 9.3  8.9 - 10.3 mg/dL Final  . GFR, Estimated 12/15/2023 >60  >60 mL/min Final   Comment: (NOTE) Calculated using the CKD-EPI Creatinine Equation (2021)   . Anion gap 12/15/2023 10  5 - 15 Final   Performed at [facility], [address], [place], [place] [address]  . WBC 12/15/2023 9.3  4.0 - 10.5 K/uL Final  . RBC 12/15/2023 4.37  4.22 - 5.81 MIL/uL Final  . Hemoglobin 12/15/2023 13.5  13.0 - 17.0 g/dL Final  . HCT 87/80/7975 39.8  39.0 - 52.0 % Final  . MCV 12/15/2023 91.1  80.0 - 100.0 fL Final  .  MCH 12/15/2023 30.9  26.0 - 34.0 pg Final  . MCHC 12/15/2023 33.9  30.0 - 36.0 g/dL Final  . RDW 87/80/7975 12.5  11.5 - 15.5 % Final  . Platelets 12/15/2023 184  150 - 400 K/uL Final  . nRBC 12/15/2023 0.0  0.0 - 0.2 % Final   Performed at [facility], [address] [place], [place] [address]  . Troponin I (High Sensitivity) 12/15/2023 5  <18 ng/L Final   Comment: (NOTE) Elevated high sensitivity troponin I (hsTnI) values and significant  changes across serial measurements may suggest ACS but many other  chronic and acute conditions are known to elevate hsTnI results.  Refer to the Links section for chest pain algorithms and additional  guidance. Performed at [facility], [address], [place], [place] [address]   . B Natriuretic Peptide 12/15/2023 25.4  0.0 - 100.0 pg/mL Final   Performed at [facility], [address] [place], [place] [address]  . Troponin I (High Sensitivity) 12/15/2023 4  <18 ng/L Final   Comment: (NOTE) Elevated high sensitivity troponin I (hsTnI) values and significant  changes across serial measurements may suggest ACS but many other  chronic and acute conditions are known to elevate hsTnI results.  Refer to the Links section for chest pain algorithms and additional  guidance. Performed at [facility], [address], [place], [place] [address]     Allergies: Patient has no known allergies.  Medications:  Facility Ordered Medications  Medication  . acetaminophen  (TYLENOL ) tablet 650 mg  . alum & mag hydroxide-simeth (MAALOX/MYLANTA) 200-200-20 MG/5ML suspension 30 mL  . magnesium hydroxide (MILK OF MAGNESIA) suspension 30 mL  . OLANZapine zydis (ZYPREXA) disintegrating tablet 5 mg  . traZODone (DESYREL) tablet 50 mg   PTA Medications  Medication Sig  . acetaminophen  (TYLENOL ) 500 MG tablet Take 500 mg by mouth every 8 (eight) hours as needed for moderate pain.   . tamsulosin  (FLOMAX ) 0.4 MG CAPS capsule Take 0.4 mg by mouth daily after supper.  SABRA aspirin EC 325 MG tablet Take 325-1,300 mg by mouth daily as needed (for headaches).  SABRA amantadine (SYMMETREL) 100 MG capsule Take 100 mg by mouth 2 (two) times daily.  . meloxicam (MOBIC) 7.5 MG tablet Take 7.5 mg by mouth daily as needed for pain.  . cyclobenzaprine  (FLEXERIL ) 10 MG tablet Take 10 mg by mouth daily.  . levETIRAcetam  (KEPPRA ) 500 MG tablet Take 500 mg by mouth every 12 (twelve) hours.  . gabapentin  (NEURONTIN ) 100 MG capsule Take 100 mg by mouth 3 (three) times daily.  . FLUoxetine  (PROZAC ) 20 MG capsule Take 20 mg by mouth every evening.  SABRA AMOXICILLIN PO Take 1 tablet by mouth 2 (two) times daily as needed (for UTI- see notes).  SABRA PEPTO-BISMOL 262 MG/15ML suspension Take 30 mLs by mouth every 6 (six) hours as needed for indigestion.  . amLODipine  (NORVASC ) 5 MG tablet Take 1 tablet (5 mg total) by mouth daily.      Medical Decision  Making  ***    Recommendations  {[facility] MSE Recommendations:304701}  Randall Bouquet, NP 11/29/24  11:22 PM

## 2024-11-29 NOTE — BH Assessment (Signed)
 Comprehensive Clinical Assessment (CCA) Note  11/30/2024 Lance Walters [id number]  Chief Complaint:  Chief Complaint  Patient presents with   Drug Problem  Disposition: Per Starlyn Randall PIETY patient is recommended for overnight observation with re-evaluation in the morning.   The patient demonstrates the following risk factors for suicide: Chronic risk factors for suicide include: psychiatric disorder of depression,anxiety and substance use disorder. Acute risk factors for suicide include: social withdrawal/isolation. Protective factors for this patient include: hope for the future. Considering these factors, the overall suicide risk at this point appears to be low. Patient is not appropriate for outpatient follow up.   Lance Walters is a 68 year old male with a history of depression, anxiety, and substance use disorder who presents voluntarily to [facility] for an assessment. Patient states he is homeless and resides at the [facility]. Patient reports isolation, crying spells, irritability, hopelessness, guilt, loss of interest to do things they enjoy, fatigue, lack of concentration, worthlessness, and change in sleep. Patient has a hx of Substance Abuse:crack, alcohol, THC,  Last use was lastnight, 1 gram of crack, 1/2 gallon of liquor, and 3-4 blunts. Patient reports withdrawal symptoms shaking and fatigue. Patient states he was released from prison in October after 9 months of being incarcerated.  Patient denies NSSIB, SI, HI, and AVH.  Patient identifies his primary stressors as housing instability, and substance abuse. Patient denies history of abuse or trauma. Patient reports he is currently on parole. Patient is receiving outpatient medication management services, with [facility]. Patient reports he  takes his medications as prescribed (see MAR) and denies recent medication changes. Patient denies access to weapons.Treatment options were  discussed and patient is in agreement with recommendation for admission to the observation unit.   During evaluation patient is in no acute distress. He is alert, oriented x 4, calm, cooperative and attentive. His mood is anxious with congruent affect. He has normal speech, and behavior.  Objectively there is no evidence of psychosis/mania or delusional thinking.  Patient is able to converse coherently, goal directed thoughts, no distractibility, or pre-occupation. Patient answered question appropriately.      Visit Diagnosis:  Polysubstance abuse (HCC)   CCA Screening, Triage and Referral (STR)  Patient Reported Information How did you hear about us ? Self  What Is the Reason for Your Visit/Call Today? Per triage note Pt presents to [facility] as a voluntary walk-in, unaccompanied requesting substance abuse treatment. Pt reports using crack (unknown amount) within the last 24 hours. Pt was released from prison in late October and reports that he resided in a few shelters and he was unable to stay clean. Pt reports using crack daily for about a month. Pt reports psychiatric diagnosis of PTSD, GAD and adjustment disorder. Pt is established with [facility] for medical and behavioral health needs. Pt currently denies SI,HI,AVH.  How Long Has This Been Causing You Problems? 1-6 months  What Do You Feel Would Help You the Most Today? Alcohol or Drug Use Treatment   Have You Recently Had Any Thoughts About Hurting Yourself? No  Are You Planning to Commit Suicide/Harm Yourself At This time? No   Flowsheet Row ED from 11/29/2024 in [facility] ED from 12/15/2023 in [facility] Emergency Department at [facility] ED from 11/17/2023 in [facility] Emergency Department at [facility]  C-SSRS RISK CATEGORY No Risk No Risk No Risk    Have you Recently Had Thoughts About Hurting Someone  Else? No  Are You Planning to Harm Someone at This Time?  No  Explanation: pt denies   Have You Used Any Alcohol or Drugs in the Past 24 Hours? Yes  How Long Ago Did You Use Drugs or Alcohol? lastnight What Did You Use and How Much? crack over a gram, alcohol- 1/2 gallon of liquor and beer, THC- 3-4 blunts: lastnight   Do You Currently Have a Therapist/Psychiatrist? Yes  Name of Therapist/Psychiatrist: Name of Therapist/Psychiatrist: [employer]- medication management   Have You Been Recently Discharged From Any Public Relations Account Executive or Programs? No  Explanation of Discharge From Practice/Program: n/a    CCA Screening Triage Referral Assessment Type of Contact: Face-to-Face  Telemedicine Service Delivery:   Is this Initial or Reassessment?   Date Telepsych consult ordered in [facility]:    Time Telepsych consult ordered in [facility]:    Location of Assessment: [facility]  Provider Location: [facility] Assessment Services   Collateral Involvement: n/a   Does Patient Have a Automotive Engineer Guardian? No  Legal Guardian Contact Information: n/a  Copy of Legal Guardianship Form: -- (n/a)  Legal Guardian Notified of Arrival: -- (n/a)  Legal Guardian Notified of Pending Discharge: -- (n/a)  If Minor and Not Living with Parent(s), Who has Custody? n/a  Is CPS involved or ever been involved? Never  Is APS involved or ever been involved? Never   Patient Determined To Be At Risk for Harm To Self or Others Based on Review of Patient Reported Information or Presenting Complaint? No  Method: No Plan  Availability of Means: No access or NA  Intent: Vague intent or NA  Notification Required: No need or identified person  Additional Information for Danger to Others Potential: -- (n/a)  Additional Comments for Danger to Others Potential: n/a  Are There Guns or Other Weapons in Your Home? No  Types of Guns/Weapons: n/a  Are These Weapons Safely Secured?                            -- (n/a)  Who Could Verify You  Are Able To Have These Secured: n/a  Do You Have any Outstanding Charges, Pending Court Dates, Parole/Probation? Pt reports he is on Parole  Contacted To Inform of Risk of Harm To Self or Others: Other: Comment (n/a)    Does Patient Present under Involuntary Commitment? No    County of Residence: Other (Comment) (homeless)   Patient Currently Receiving the Following Services: Medication Management   Determination of Need: Urgent (48 hours)   Options For Referral: Chemical Dependency Intensive Outpatient Therapy (CDIOP); Facility-Based Crisis; Medication Management; Outpatient Therapy     CCA Biopsychosocial Patient Reported Schizophrenia/Schizoaffective Diagnosis in Past: No   Strengths: Seeking Treatment   Mental Health Symptoms Depression:  Change in energy/activity; Difficulty Concentrating; Fatigue; Hopelessness; Irritability; Sleep (too much or little); Tearfulness; Worthlessness   Duration of Depressive symptoms: Duration of Depressive Symptoms: Greater than two weeks   Mania:  N/A   Anxiety:   Worrying; Tension   Psychosis:  None   Duration of Psychotic symptoms:    Trauma:  N/A   Obsessions:  N/A   Compulsions:  N/A   Inattention:  N/A   Hyperactivity/Impulsivity:  N/A   Oppositional/Defiant Behaviors:  N/A   Emotional Irregularity:  N/A   Other Mood/Personality Symptoms:  n/a    Mental Status Exam Appearance and self-care  Stature:  Average   Weight:  Average weight   Clothing:  Casual   Grooming:  Normal   Cosmetic use:  None   Posture/gait:  Normal   Motor activity:  Not Remarkable   Sensorium  Attention:  Normal   Concentration:  Normal   Orientation:  X5   Recall/memory:  Normal   Affect and Mood  Affect:  Depressed   Mood:  Depressed; Anxious   Relating  Eye contact:  Normal   Facial expression:  Responsive; Anxious   Attitude toward examiner:  Cooperative   Thought and Language  Speech flow: Clear and  Coherent   Thought content:  Appropriate to Mood and Circumstances   Preoccupation:  None   Hallucinations:  None   Organization:  Coherent   [employer] of Knowledge:  Average   Intelligence:  Average   Abstraction:  Normal   Judgement:  Impaired   Reality Testing:  Adequate   Insight:  Fair   Decision Making:  Normal   Social Functioning  Social Maturity:  Irresponsible   Social Judgement:  Chief Of Staff   Stress  Stressors:  Other (Comment) (substance abuse)   Coping Ability:  Overwhelmed   Skill Deficits:  Self-control; Self-care   Supports:  Support needed     Religion: Religion/Spirituality Are You A Religious Person?: Yes What is Your Religious Affiliation?: Muslim How Might This Affect Treatment?: n/a  Leisure/Recreation: Leisure / Recreation Do You Have Hobbies?: No  Exercise/Diet: Exercise/Diet Do You Exercise?: No Have You Gained or Lost A Significant Amount of Weight in the Past Six Months?: No Do You Follow a Special Diet?: No Do You Have Any Trouble Sleeping?: Yes Explanation of Sleeping Difficulties: Reports poor sleep   CCA Employment/Education Employment/Work Situation: Employment / Work Psychologist, Occupational Employment Situation: Retired Passenger Transport Manager has Been Impacted by Current Illness: No Has Patient ever Been in Equities Trader?: No  Education: Education Is Patient Currently Attending School?: No Last Grade Completed: 12 Did You Product Manager?: No Did You Have An Individualized Education Program (IIEP): No Did You Have Any Difficulty At [employer]?: No Patient's Education Has Been Impacted by Current Illness: No   CCA Family/Childhood History Family and Relationship History: Family history Marital status: Single Does patient have children?:  (uta)  Childhood History:  Childhood History By whom was/is the patient raised?: Other (Comment) (uta) Did patient suffer any verbal/emotional/physical/sexual abuse as a  child?: No Did patient suffer from severe childhood neglect?: No Has patient ever been sexually abused/assaulted/raped as an adolescent or adult?: No Was the patient ever a victim of a crime or a disaster?: No Witnessed domestic violence?: No Has patient been affected by domestic violence as an adult?: No       CCA Substance Use Alcohol/Drug Use: Alcohol / Drug Use Pain Medications: n/a Prescriptions: n/a Over the Counter: n/a History of alcohol / drug use?: No history of alcohol / drug abuse                         ASAM's:  Six Dimensions of Multidimensional Assessment  Dimension 1:  Acute Intoxication and/or Withdrawal Potential:      Dimension 2:  Biomedical Conditions and Complications:      Dimension 3:  Emotional, Behavioral, or Cognitive Conditions and Complications:     Dimension 4:  Readiness to Change:     Dimension 5:  Relapse, Continued use, or Continued Problem Potential:     Dimension 6:  Recovery/Living Environment:  ASAM Severity Score:    ASAM Recommended Level of Treatment:     Substance use Disorder (SUD)    Recommendations for Services/Supports/Treatments:    Disposition Recommendation per psychiatric provider: Admission to observation unit.   DSM5 Diagnoses: Patient Active Problem List   Diagnosis Date Noted   Nicotine  dependence, cigarettes, uncomplicated 11/22/2023   Mixed hyperlipidemia 11/11/2023   Chest pain 11/10/2023   Hypokalemia 11/10/2023   Abnormal finding on echocardiogram 11/10/2023   Grade I diastolic dysfunction 11/10/2023   Polyp of colon 03/12/2022   Diverticulosis 03/12/2022   Incontinence of feces 12/14/2021   Left lower quadrant pain 12/14/2021   Pyuria 12/14/2021   Bright red stool 09/21/2021   Polyneuropathy, unspecified 02/04/2021   Abnormal renal function test 09/18/2020   Arthritis of right hip 09/18/2020   Neuropathy 09/18/2020   Spinal stenosis in cervical region 09/18/2020   Spinal stenosis of  lumbosacral region 09/18/2020   Back pain 08/15/2020   Benign prostatic hyperplasia without lower urinary tract symptoms 08/15/2020   Cocaine use 08/15/2020   Essential hypertension 08/15/2020   Seizure disorder (HCC) 08/15/2020   Substance use disorder 08/15/2020     Referrals to Alternative Service(s): Referred to Alternative Service(s):   Place:   Date:   Time:    Referred to Alternative Service(s):   Place:   Date:   Time:    Referred to Alternative Service(s):   Place:   Date:   Time:    Referred to Alternative Service(s):   Place:   Date:   Time:     Myers Tutterow C Allona Gondek, LCMHCA

## 2024-11-29 NOTE — ED Provider Notes (Signed)
 [facility] Continuous Assessment Admission H&P  Date: 11/29/24 Patient Name: Lance Walters MRN: [id number] Chief Complaint: I need to detox  Diagnoses:  Final diagnoses:  Polysubstance abuse [facility])    HPI: Lance Walters is a 68 year-old male who presents to [facility] voluntarily, unaccompanied, reporting that he needs to detox from substances. Reports he was recently released from prison and was unable to maintain sobriety.  Reports using crack, Marijuana and alcohol on a daily basis. Last use was last night: Patient reports using 1/2 gallon of liquor, 12 beers, crack and Marijuana.  Reports he was hanging with the wrong people and relapsed. He reports a hx of PTSD and Anxiety/Adjustment disorder. He denies SI/HI/AVH.  He reports a hx of treatment/rehab in [place] for 14 days. Stayed sober for a little while then relapsed.   Patient is evaluated face-to-face by this NP. He appears tired, anxious, restless. He is cooperative with the assessment, reporting that he wants to detox from substances, then go to a rehab service. He was released from the prison on 10/24/2024 and was unable to keep up with sobriety. Reports he currently stays at a shelter. Wants to improve himself to qualify for housing. Reports he went to prison due to drug use and possession. Reports no  outpatient services. States he had been contemplating on getting help but I am tired. Reports that family is supportive. Reports his appetite is good. Has not been sleeping well.   During this assessment, patient is alert and oriented. He is casually dressed and groomed. Speech is pressured. His eye contact is fair. He remains cooperative and expresses motivation for treatment. He is admitted to Observation unit. To be admitted to [facility], pending labs.   Total Time spent with patient: 45 minutes  Musculoskeletal  Strength & Muscle Tone: within normal limits Gait & Station: normal Patient leans: N/A  Psychiatric  Specialty Exam  Presentation General Appearance:  Disheveled; Casual  Eye Contact: Fair  Speech: Pressured  Speech Volume: Normal  Handedness: Right   Mood and Affect  Mood: Anxious  Affect: Congruent   Thought Process  Thought Processes: Coherent  Descriptions of Associations:Intact  Orientation:Full (Time, Place and Person)  Thought Content:WDL    Hallucinations:Hallucinations: None  Ideas of Reference:None  Suicidal Thoughts:Suicidal Thoughts: No  Homicidal Thoughts:Homicidal Thoughts: No   Sensorium  Memory: Immediate Fair; Recent Fair; Remote Fair  Judgment: Fair  Insight: Fair   Art Therapist  Concentration: Fair  Attention Span: Fair  Recall: [employer] of Knowledge: Fair  Language: Fair   Psychomotor Activity  Psychomotor Activity: Psychomotor Activity: Restlessness   Assets  Assets: Communication Skills; Desire for Improvement; Resilience   Sleep  Sleep: Sleep: Poor Number of Hours of Sleep: 2   Nutritional Assessment (For OBS and FBC admissions only) Has the patient had a decrease in food intake/or appetite?: No Does the patient have dental problems?: No Does the patient have eating habits or behaviors that may be indicators of an eating disorder including binging or inducing vomiting?: No Has the patient recently lost weight without trying?: 0 Has the patient been eating poorly because of a decreased appetite?: 0 Malnutrition Screening Tool Score: 0    Physical Exam Vitals and nursing note reviewed.  HENT:     Head: Normocephalic and atraumatic.     Right Ear: Tympanic membrane normal.     Left Ear: Tympanic membrane normal.     Nose: Nose normal.  Eyes:     Extraocular Movements: Extraocular  movements intact.     Pupils: Pupils are equal, round, and reactive to light.  Cardiovascular:     Rate and Rhythm: Normal rate.     Pulses: Normal pulses.  Pulmonary:     Effort: Pulmonary effort is  normal.  Musculoskeletal:        General: Normal range of motion.     Cervical back: Normal range of motion.  Neurological:     General: No focal deficit present.     Mental Status: He is alert and oriented to person, place, and time.  Psychiatric:        Thought Content: Thought content normal.    Review of Systems  Constitutional: Negative.   HENT: Negative.    Eyes: Negative.   Respiratory: Negative.    Cardiovascular: Negative.   Gastrointestinal: Negative.   Genitourinary: Negative.   Musculoskeletal: Negative.   Skin: Negative.   Neurological: Negative.   Endo/Heme/Allergies: Negative.   Psychiatric/Behavioral:  Positive for substance abuse. The patient is nervous/anxious and has insomnia.     Blood pressure (!) 119/90, pulse 98, temperature 98.5 F (36.9 C), temperature source Oral, resp. rate 20, SpO2 98%. There is no height or weight on file to calculate BMI.  Past Psychiatric History: PTSD, Adjustment Disorder, polysubstance abuse   Is the patient at risk to self? No  Has the patient been a risk to self in the past 6 months? No .    Has the patient been a risk to self within the distant past? No   Is the patient a risk to others? No   Has the patient been a risk to others in the past 6 months? No   Has the patient been a risk to others within the distant past? No   Past Medical History: NA  Family History: NA  Social History: Homeless, unemployed  Last Labs:  No visits with results within 6 Month(s) from this visit.  Latest known visit with results is:  Admission on 12/15/2023, Discharged on 12/15/2023  Component Date Value Ref Range Status   Sodium 12/15/2023 138  135 - 145 mmol/L Final   Potassium 12/15/2023 3.6  3.5 - 5.1 mmol/L Final   Chloride 12/15/2023 107  98 - 111 mmol/L Final   CO2 12/15/2023 21 (L)  22 - 32 mmol/L Final   Glucose, Bld 12/15/2023 90  70 - 99 mg/dL Final   Glucose reference range applies only to samples taken after fasting for  at least 8 hours.   BUN 12/15/2023 17  8 - 23 mg/dL Final   Creatinine, Ser 12/15/2023 1.14  0.61 - 1.24 mg/dL Final   Calcium 87/80/7975 9.3  8.9 - 10.3 mg/dL Final   GFR, Estimated 12/15/2023 >60  >60 mL/min Final   Comment: (NOTE) Calculated using the CKD-EPI Creatinine Equation (2021)    Anion gap 12/15/2023 10  5 - 15 Final   Performed at [facility], [address], [place], [place] [address]   WBC 12/15/2023 9.3  4.0 - 10.5 K/uL Final   RBC 12/15/2023 4.37  4.22 - 5.81 MIL/uL Final   Hemoglobin 12/15/2023 13.5  13.0 - 17.0 g/dL Final   HCT 87/80/7975 39.8  39.0 - 52.0 % Final   MCV 12/15/2023 91.1  80.0 - 100.0 fL Final   MCH 12/15/2023 30.9  26.0 - 34.0 pg Final   MCHC 12/15/2023 33.9  30.0 - 36.0 g/dL Final   RDW 87/80/7975 12.5  11.5 - 15.5 % Final   Platelets  12/15/2023 184  150 - 400 K/uL Final   nRBC 12/15/2023 0.0  0.0 - 0.2 % Final   Performed at [facility], [address] [place], [place] [address]   Troponin I (High Sensitivity) 12/15/2023 5  <18 ng/L Final   Comment: (NOTE) Elevated high sensitivity troponin I (hsTnI) values and significant  changes across serial measurements may suggest ACS but many other  chronic and acute conditions are known to elevate hsTnI results.  Refer to the Links section for chest pain algorithms and additional  guidance. Performed at [facility], [address] [place], [place] [address]    B Natriuretic Peptide 12/15/2023 25.4  0.0 - 100.0 pg/mL Final   Performed at [facility], [address] [place], [place] [address]   Troponin I (High Sensitivity) 12/15/2023 4  <18 ng/L Final   Comment: (NOTE) Elevated high sensitivity troponin I (hsTnI) values and significant  changes across serial measurements may suggest ACS but many other  chronic and acute conditions are known to elevate hsTnI results.  Refer to the Links section for chest pain  algorithms and additional  guidance. Performed at [facility], [address], [place], [place] [address]     Allergies: Patient has no known allergies.  Medications:  Facility Ordered Medications  Medication   acetaminophen  (TYLENOL ) tablet 650 mg   alum & mag hydroxide-simeth (MAALOX/MYLANTA) 200-200-20 MG/5ML suspension 30 mL   magnesium  hydroxide (MILK OF MAGNESIA) suspension 30 mL   OLANZapine  zydis (ZYPREXA ) disintegrating tablet 5 mg   traZODone  (DESYREL ) tablet 50 mg   PTA Medications  Medication Sig   acetaminophen  (TYLENOL ) 500 MG tablet Take 500 mg by mouth every 8 (eight) hours as needed for moderate pain.   tamsulosin  (FLOMAX ) 0.4 MG CAPS capsule Take 0.4 mg by mouth daily after supper.   aspirin EC 325 MG tablet Take 325-1,300 mg by mouth daily as needed (for headaches).   amantadine (SYMMETREL) 100 MG capsule Take 100 mg by mouth 2 (two) times daily.   meloxicam (MOBIC) 7.5 MG tablet Take 7.5 mg by mouth daily as needed for pain.   cyclobenzaprine  (FLEXERIL ) 10 MG tablet Take 10 mg by mouth daily.   levETIRAcetam  (KEPPRA ) 500 MG tablet Take 500 mg by mouth every 12 (twelve) hours.   gabapentin  (NEURONTIN ) 100 MG capsule Take 100 mg by mouth 3 (three) times daily.   FLUoxetine  (PROZAC ) 20 MG capsule Take 20 mg by mouth every evening.   AMOXICILLIN PO Take 1 tablet by mouth 2 (two) times daily as needed (for UTI- see notes).   PEPTO-BISMOL 262 MG/15ML suspension Take 30 mLs by mouth every 6 (six) hours as needed for indigestion.   amLODipine  (NORVASC ) 5 MG tablet Take 1 tablet (5 mg total) by mouth daily.      Medical Decision Making   Admission to Observation. FBC recommended, pending labs.   Agitation protocol ordered  EKG ordered  Acetaminophen  650 mg PO  Q 6 PRN Maalox 30 ml PO Q 4 PRN Milk of magnesia 30 ml PO Daily PRN Trazodone  50 mg PO HS PRN Home medications to be verified.   Labs: CBC, CMP, A1C, TSH, UA, UDS, Ethanol,  Hepatic Function, Lipid Panel, Magnesium     Recommendations  Based on my evaluation the patient does not appear to have an emergency medical condition.  Randall Bouquet, NP 11/29/24  11:22 PM

## 2024-11-30 ENCOUNTER — Other Ambulatory Visit: Payer: Self-pay

## 2024-11-30 ENCOUNTER — Encounter (HOSPITAL_COMMUNITY): Payer: Self-pay

## 2024-11-30 ENCOUNTER — Other Ambulatory Visit (HOSPITAL_COMMUNITY)
Admission: EM | Admit: 2024-11-30 | Discharge: 2024-12-05 | Disposition: A | Source: Intra-hospital | Attending: Psychiatry | Admitting: Psychiatry

## 2024-11-30 DIAGNOSIS — F19982 Other psychoactive substance use, unspecified with psychoactive substance-induced sleep disorder: Secondary | ICD-10-CM

## 2024-11-30 DIAGNOSIS — I444 Left anterior fascicular block: Secondary | ICD-10-CM | POA: Diagnosis not present

## 2024-11-30 DIAGNOSIS — Z5901 Sheltered homelessness: Secondary | ICD-10-CM | POA: Diagnosis not present

## 2024-11-30 DIAGNOSIS — Z5900 Homelessness unspecified: Secondary | ICD-10-CM | POA: Diagnosis not present

## 2024-11-30 DIAGNOSIS — R450 Nervousness: Secondary | ICD-10-CM | POA: Insufficient documentation

## 2024-11-30 DIAGNOSIS — F1910 Other psychoactive substance abuse, uncomplicated: Secondary | ICD-10-CM | POA: Diagnosis present

## 2024-11-30 DIAGNOSIS — F411 Generalized anxiety disorder: Secondary | ICD-10-CM

## 2024-11-30 DIAGNOSIS — F4310 Post-traumatic stress disorder, unspecified: Secondary | ICD-10-CM | POA: Diagnosis not present

## 2024-11-30 DIAGNOSIS — F1290 Cannabis use, unspecified, uncomplicated: Secondary | ICD-10-CM | POA: Diagnosis not present

## 2024-11-30 DIAGNOSIS — F1090 Alcohol use, unspecified, uncomplicated: Secondary | ICD-10-CM

## 2024-11-30 DIAGNOSIS — F419 Anxiety disorder, unspecified: Secondary | ICD-10-CM | POA: Diagnosis not present

## 2024-11-30 DIAGNOSIS — F1410 Cocaine abuse, uncomplicated: Secondary | ICD-10-CM

## 2024-11-30 DIAGNOSIS — F1490 Cocaine use, unspecified, uncomplicated: Secondary | ICD-10-CM | POA: Diagnosis present

## 2024-11-30 DIAGNOSIS — F332 Major depressive disorder, recurrent severe without psychotic features: Secondary | ICD-10-CM

## 2024-11-30 DIAGNOSIS — Z79899 Other long term (current) drug therapy: Secondary | ICD-10-CM | POA: Insufficient documentation

## 2024-11-30 DIAGNOSIS — F1990 Other psychoactive substance use, unspecified, uncomplicated: Secondary | ICD-10-CM

## 2024-11-30 DIAGNOSIS — G4700 Insomnia, unspecified: Secondary | ICD-10-CM | POA: Diagnosis not present

## 2024-11-30 DIAGNOSIS — F321 Major depressive disorder, single episode, moderate: Secondary | ICD-10-CM

## 2024-11-30 DIAGNOSIS — F17210 Nicotine dependence, cigarettes, uncomplicated: Secondary | ICD-10-CM | POA: Diagnosis present

## 2024-11-30 LAB — CBC WITH DIFFERENTIAL/PLATELET
Abs Immature Granulocytes: 0.04 K/uL (ref 0.00–0.07)
Basophils Absolute: 0.1 K/uL (ref 0.0–0.1)
Basophils Relative: 1 %
Eosinophils Absolute: 0.2 K/uL (ref 0.0–0.5)
Eosinophils Relative: 2 %
HCT: 39.9 % (ref 39.0–52.0)
Hemoglobin: 13 g/dL (ref 13.0–17.0)
Immature Granulocytes: 0 %
Lymphocytes Relative: 33 %
Lymphs Abs: 3 K/uL (ref 0.7–4.0)
MCH: 29.5 pg (ref 26.0–34.0)
MCHC: 32.6 g/dL (ref 30.0–36.0)
MCV: 90.7 fL (ref 80.0–100.0)
Monocytes Absolute: 0.8 K/uL (ref 0.1–1.0)
Monocytes Relative: 9 %
Neutro Abs: 4.9 K/uL (ref 1.7–7.7)
Neutrophils Relative %: 55 %
Platelets: 238 K/uL (ref 150–400)
RBC: 4.4 MIL/uL (ref 4.22–5.81)
RDW: 13.9 % (ref 11.5–15.5)
WBC: 9 K/uL (ref 4.0–10.5)
nRBC: 0 % (ref 0.0–0.2)

## 2024-11-30 LAB — COMPREHENSIVE METABOLIC PANEL WITH GFR
ALT: 12 U/L (ref 0–44)
AST: 17 U/L (ref 15–41)
Albumin: 3 g/dL — ABNORMAL LOW (ref 3.5–5.0)
Alkaline Phosphatase: 82 U/L (ref 38–126)
Anion gap: 10 (ref 5–15)
BUN: 21 mg/dL (ref 8–23)
CO2: 22 mmol/L (ref 22–32)
Calcium: 8.6 mg/dL — ABNORMAL LOW (ref 8.9–10.3)
Chloride: 109 mmol/L (ref 98–111)
Creatinine, Ser: 1.31 mg/dL — ABNORMAL HIGH (ref 0.61–1.24)
GFR, Estimated: 59 mL/min — ABNORMAL LOW (ref >60)
Glucose, Bld: 107 mg/dL — ABNORMAL HIGH (ref 70–99)
Potassium: 4.4 mmol/L (ref 3.5–5.1)
Sodium: 141 mmol/L (ref 135–145)
Total Bilirubin: 0.4 mg/dL (ref 0.0–1.2)
Total Protein: 6.4 g/dL — ABNORMAL LOW (ref 6.5–8.1)

## 2024-11-30 LAB — URINALYSIS, ROUTINE W REFLEX MICROSCOPIC
Bilirubin Urine: NEGATIVE
Glucose, UA: NEGATIVE mg/dL
Ketones, ur: NEGATIVE mg/dL
Nitrite: NEGATIVE
Protein, ur: 30 mg/dL — AB
Specific Gravity, Urine: 1.031 — ABNORMAL HIGH (ref 1.005–1.030)
WBC, UA: >50 WBC/hpf (ref 0–5)
pH: 5 (ref 5.0–8.0)

## 2024-11-30 LAB — LIPID PANEL
Cholesterol: 155 mg/dL (ref 0–200)
HDL: 46 mg/dL (ref >40)
LDL Cholesterol: 98 mg/dL (ref 0–99)
Total CHOL/HDL Ratio: 3.4 ratio
Triglycerides: 53 mg/dL (ref <150)
VLDL: 11 mg/dL (ref 0–40)

## 2024-11-30 LAB — HEMOGLOBIN A1C
Hgb A1c MFr Bld: 5.3 % (ref 4.8–5.6)
Mean Plasma Glucose: 105.41 mg/dL

## 2024-11-30 LAB — TSH: TSH: 0.672 u[IU]/mL (ref 0.350–4.500)

## 2024-11-30 LAB — ETHANOL: Alcohol, Ethyl (B): <15 mg/dL (ref <15)

## 2024-11-30 LAB — MAGNESIUM: Magnesium: 2.2 mg/dL (ref 1.7–2.4)

## 2024-11-30 MED ORDER — TRAZODONE HCL 50 MG PO TABS
50.0000 mg | ORAL_TABLET | Freq: Every evening | ORAL | Status: DC | PRN
  Administered 2024-11-30 – 2024-12-04 (×5): 50 mg via ORAL
  Filled 2024-11-30: qty 1
  Filled 2024-11-30: qty 15
  Filled 2024-11-30: qty 7
  Filled 2024-11-30 (×4): qty 1
  Filled 2024-11-30: qty 15

## 2024-11-30 MED ORDER — ALUM & MAG HYDROXIDE-SIMETH 200-200-20 MG/5ML PO SUSP: 30.0000 mL | ORAL | Status: DC | PRN

## 2024-11-30 MED ORDER — AMLODIPINE BESYLATE 5 MG PO TABS
5.0000 mg | ORAL_TABLET | Freq: Once | ORAL | Status: AC
  Administered 2024-11-30: 5 mg via ORAL
  Filled 2024-11-30: qty 1

## 2024-11-30 MED ORDER — MAGNESIUM HYDROXIDE 400 MG/5ML PO SUSP
30.0000 mL | Freq: Every day | ORAL | Status: DC | PRN
  Administered 2024-12-04: 30 mL via ORAL
  Filled 2024-11-30: qty 30

## 2024-11-30 MED ORDER — ACETAMINOPHEN 325 MG PO TABS: 650.0000 mg | ORAL_TABLET | Freq: Four times a day (QID) | ORAL | Status: DC | PRN

## 2024-11-30 MED ORDER — OLANZAPINE 5 MG PO TBDP: 5.0000 mg | ORAL_TABLET | Freq: Three times a day (TID) | ORAL | Status: DC | PRN

## 2024-11-30 NOTE — Group Note (Signed)
 Group Topic: Communication  Group Date: 11/30/2024 Start Time: 0745 End Time: 0830 Facilitators: Judi Monico RAMAN, NT  Department: [facility]  Number of Participants: 7  Group Focus: check in Treatment Modality:  Psychoeducation Interventions utilized were patient education Purpose: express feelings and increase insight  Name: Lance Walters Date of Birth: 1956-01-06  MR: [id number]    Level of Participation: Pt did not attend group  Patients Problems:  Patient Active Problem List   Diagnosis Date Noted   Polysubstance abuse (HCC) 11/30/2024   Nicotine  dependence, cigarettes, uncomplicated 11/22/2023   Mixed hyperlipidemia 11/11/2023   Chest pain 11/10/2023   Hypokalemia 11/10/2023   Abnormal finding on echocardiogram 11/10/2023   Grade I diastolic dysfunction 11/10/2023   Polyp of colon 03/12/2022   Diverticulosis 03/12/2022   Incontinence of feces 12/14/2021   Left lower quadrant pain 12/14/2021   Pyuria 12/14/2021   Bright red stool 09/21/2021   Polyneuropathy, unspecified 02/04/2021   Abnormal renal function test 09/18/2020   Arthritis of right hip 09/18/2020   Neuropathy 09/18/2020   Spinal stenosis in cervical region 09/18/2020   Spinal stenosis of lumbosacral region 09/18/2020   Back pain 08/15/2020   Benign prostatic hyperplasia without lower urinary tract symptoms 08/15/2020   Cocaine use 08/15/2020   Essential hypertension 08/15/2020   Seizure disorder (HCC) 08/15/2020   Substance use disorder 08/15/2020

## 2024-11-30 NOTE — ED Provider Notes (Cosign Needed Addendum)
 Facility Based Crisis Admission H&P  Date: 11/29/24 Patient Name: Lance Walters MRN: [id number] Chief Complaint: I need to detox  Diagnoses:  Final diagnoses:  Polysubstance abuse [name] is a male who presents voluntarily and unaccompanied to [facility], reporting the need for detoxification from substances. He states he was recently released from prison and has struggled to maintain sobriety since that time. He reports daily use of crack cocaine, marijuana, and alcohol, with last use occurring last night. He reports consuming approximately one-half gallon of liquor, twelve beers, crack cocaine, and marijuana. He attributes relapse to associating with negative influences following release. The patient describes a history of PTSD and Anxiety/Adjustment Disorder, and has previously participated in a 14-day rehabilitation program in [place]. He maintained sobriety briefly before relapsing. He denies suicidal ideation, homicidal ideation, or auditory/visual hallucinations.  The patient was evaluated face-to-face and appears tired, anxious, and mildly restless. He is cooperative with the assessment and expresses strong motivation to detox and pursue rehabilitation following stabilization. He reports currently living at a local shelter and desires to achieve sobriety to qualify for stable housing. He acknowledges prior incarceration for substance use and possession charges. He denies current engagement with outpatient psychiatric or substance use services but expresses readiness for treatment, stating, "I'm just tired." He describes his family as supportive, reports fair appetite, and notes poor sleep.  On examination, the patient is alert and oriented. He is casually dressed and adequately groomed. Speech is pressured, and eye contact is fair. Affect is anxious but appropriate to content. He denies current psychotic symptoms, suicidal thoughts, or self-harm intent. Thought process  is linear, and he demonstrates insight into the need for change. Judgment appears fair. No signs of acute withdrawal are observed at this time.  The patient will be admitted to the observation unit for continued monitoring and stabilization, with pending laboratory results to determine medical clearance for [facility] admission. His presentation and history are consistent with Cocaine Use Disorder, severe; Alcohol Use Disorder, severe; Cannabis Use Disorder, moderate; Post-Traumatic Stress Disorder; and Adjustment Disorder with anxiety.  Total Time spent with patient: 45 minutes  Musculoskeletal  Strength & Muscle Tone: within normal limits Gait & Station: normal Patient leans: N/A  Psychiatric Specialty Exam  Presentation General Appearance:  Disheveled; Casual  Eye Contact: Fair  Speech: Pressured  Speech Volume: Normal  Handedness: Right   Mood and Affect  Mood: Anxious  Affect: Congruent   Thought Process  Thought Processes: Coherent  Descriptions of Associations:Intact  Orientation:Full (Time, Place and Person)  Thought Content:WDL    Hallucinations:Hallucinations: None  Ideas of Reference:None  Suicidal Thoughts:Suicidal Thoughts: No  Homicidal Thoughts:Homicidal Thoughts: No   Sensorium  Memory: Immediate Fair; Recent Fair; Remote Fair  Judgment: Fair  Insight: Fair   Art Therapist  Concentration: Fair  Attention Span: Fair  Recall: [employer] of Knowledge: Fair  Language: Fair   Psychomotor Activity  Psychomotor Activity: Psychomotor Activity: Restlessness   Assets  Assets: Communication Skills; Desire for Improvement; Resilience   Sleep  Sleep: Sleep: Poor Number of Hours of Sleep: 2   Nutritional Assessment (For OBS and FBC admissions only) Has the patient had a decrease in food intake/or appetite?: No Does the patient have dental problems?: No Does the patient have eating habits or behaviors that may be  indicators of an eating disorder including binging or inducing vomiting?: No Has the patient recently lost weight without trying?: 0 Has the patient been eating poorly because of a decreased  appetite?: 0 Malnutrition Screening Tool Score: 0    Physical Exam Vitals and nursing note reviewed.  HENT:     Head: Normocephalic and atraumatic.     Right Ear: Tympanic membrane normal.     Left Ear: Tympanic membrane normal.     Nose: Nose normal.  Eyes:     Extraocular Movements: Extraocular movements intact.     Pupils: Pupils are equal, round, and reactive to light.  Cardiovascular:     Rate and Rhythm: Normal rate.     Pulses: Normal pulses.  Pulmonary:     Effort: Pulmonary effort is normal.  Musculoskeletal:        General: Normal range of motion.     Cervical back: Normal range of motion.  Neurological:     General: No focal deficit present.     Mental Status: He is alert and oriented to person, place, and time.  Psychiatric:        Thought Content: Thought content normal.    Review of Systems  Constitutional: Negative.   HENT: Negative.    Eyes: Negative.   Respiratory: Negative.    Cardiovascular: Negative.   Gastrointestinal: Negative.   Genitourinary: Negative.   Musculoskeletal: Negative.   Skin: Negative.   Neurological: Negative.   Endo/Heme/Allergies: Negative.   Psychiatric/Behavioral:  Positive for substance abuse. The patient is nervous/anxious and has insomnia.     Blood pressure (!) 119/90, pulse 98, temperature 98.5 F (36.9 C), temperature source Oral, resp. rate 20, SpO2 98%. There is no height or weight on file to calculate BMI.  Past Psychiatric History: PTSD, Adjustment Disorder, polysubstance abuse   Is the patient at risk to self? No  Has the patient been a risk to self in the past 6 months? No .    Has the patient been a risk to self within the distant past? No   Is the patient a risk to others? No   Has the patient been a risk to others in  the past 6 months? No   Has the patient been a risk to others within the distant past? No   Past Medical History: NA  Family History: NA  Social History: Homeless, unemployed  Last Labs:  No visits with results within 6 Month(s) from this visit.  Latest known visit with results is:  Admission on 12/15/2023, Discharged on 12/15/2023  Component Date Value Ref Range Status   Sodium 12/15/2023 138  135 - 145 mmol/L Final   Potassium 12/15/2023 3.6  3.5 - 5.1 mmol/L Final   Chloride 12/15/2023 107  98 - 111 mmol/L Final   CO2 12/15/2023 21 (L)  22 - 32 mmol/L Final   Glucose, Bld 12/15/2023 90  70 - 99 mg/dL Final   Glucose reference range applies only to samples taken after fasting for at least 8 hours.   BUN 12/15/2023 17  8 - 23 mg/dL Final   Creatinine, Ser 12/15/2023 1.14  0.61 - 1.24 mg/dL Final   Calcium 87/80/7975 9.3  8.9 - 10.3 mg/dL Final   GFR, Estimated 12/15/2023 >60  >60 mL/min Final   Comment: (NOTE) Calculated using the CKD-EPI Creatinine Equation (2021)    Anion gap 12/15/2023 10  5 - 15 Final   Performed at [facility], [address], [place], [place] [address]   WBC 12/15/2023 9.3  4.0 - 10.5 K/uL Final   RBC 12/15/2023 4.37  4.22 - 5.81 MIL/uL Final   Hemoglobin 12/15/2023 13.5  13.0 - 17.0  g/dL Final   HCT 87/80/7975 39.8  39.0 - 52.0 % Final   MCV 12/15/2023 91.1  80.0 - 100.0 fL Final   MCH 12/15/2023 30.9  26.0 - 34.0 pg Final   MCHC 12/15/2023 33.9  30.0 - 36.0 g/dL Final   RDW 87/80/7975 12.5  11.5 - 15.5 % Final   Platelets 12/15/2023 184  150 - 400 K/uL Final   nRBC 12/15/2023 0.0  0.0 - 0.2 % Final   Performed at [facility], [address] [place], [place] [address]   Troponin I (High Sensitivity) 12/15/2023 5  <18 ng/L Final   Comment: (NOTE) Elevated high sensitivity troponin I (hsTnI) values and significant  changes across serial measurements may suggest ACS but many other  chronic and acute conditions  are known to elevate hsTnI results.  Refer to the Links section for chest pain algorithms and additional  guidance. Performed at [facility], [address] [place], [place] [address]    B Natriuretic Peptide 12/15/2023 25.4  0.0 - 100.0 pg/mL Final   Performed at [facility], [address], [place], [place] [address]   Troponin I (High Sensitivity) 12/15/2023 4  <18 ng/L Final   Comment: (NOTE) Elevated high sensitivity troponin I (hsTnI) values and significant  changes across serial measurements may suggest ACS but many other  chronic and acute conditions are known to elevate hsTnI results.  Refer to the Links section for chest pain algorithms and additional  guidance. Performed at [facility], [address], [place], [place] [address]     Allergies: Patient has no known allergies.  Medications:  Facility Ordered Medications  Medication   acetaminophen  (TYLENOL ) tablet 650 mg   alum & mag hydroxide-simeth (MAALOX/MYLANTA) 200-200-20 MG/5ML suspension 30 mL   magnesium  hydroxide (MILK OF MAGNESIA) suspension 30 mL   OLANZapine  zydis (ZYPREXA ) disintegrating tablet 5 mg   traZODone  (DESYREL ) tablet 50 mg   PTA Medications  Medication Sig   acetaminophen  (TYLENOL ) 500 MG tablet Take 500 mg by mouth every 8 (eight) hours as needed for moderate pain.   tamsulosin  (FLOMAX ) 0.4 MG CAPS capsule Take 0.4 mg by mouth daily after supper.   aspirin EC 325 MG tablet Take 325-1,300 mg by mouth daily as needed (for headaches).   amantadine (SYMMETREL) 100 MG capsule Take 100 mg by mouth 2 (two) times daily.   meloxicam (MOBIC) 7.5 MG tablet Take 7.5 mg by mouth daily as needed for pain.   cyclobenzaprine  (FLEXERIL ) 10 MG tablet Take 10 mg by mouth daily.   levETIRAcetam  (KEPPRA ) 500 MG tablet Take 500 mg by mouth every 12 (twelve) hours.   gabapentin  (NEURONTIN ) 100 MG capsule Take 100 mg by mouth 3 (three) times daily.    FLUoxetine  (PROZAC ) 20 MG capsule Take 20 mg by mouth every evening.   AMOXICILLIN PO Take 1 tablet by mouth 2 (two) times daily as needed (for UTI- see notes).   PEPTO-BISMOL 262 MG/15ML suspension Take 30 mLs by mouth every 6 (six) hours as needed for indigestion.   amLODipine  (NORVASC ) 5 MG tablet Take 1 tablet (5 mg total) by mouth daily.    Long Term Goals: Improvement in symptoms so as ready for discharge  Short Term Goals: Patient will verbalize feelings in meetings with treatment team members., Patient will attend at least of 50% of the groups daily., Pt will complete the PHQ9 on admission, day 3 and discharge., Patient will participate in completing the [place]  Suicide Severity Rating Scale, Patient will score a low risk of violence for 24 hours prior to discharge, and Patient will take medications as prescribed daily.   Medical Decision Making  The patient presents with active polysubstance use and recent relapse following release from incarceration. He currently demonstrates motivation for detoxification and subsequent rehabilitation. Based on his report of heavy alcohol and cocaine use, there is a risk for withdrawal symptoms, though none are currently evident. The plan is for admission to the observation unit for monitoring under CIWA and COWS protocols, with PRN medications provided as indicated. No acute psychiatric instability or suicidal ideation is identified. The patient is psychiatrically appropriate for crisis stabilization and withdrawal management at the facility-based level of care. Case management will assist with linkage to residential rehabilitation upon completion of detox. Risk of harm to self or others is low at this time, but the patient remains at high risk for relapse without continued structured support.  Recommendations  Based on my evaluation the patient does not appear to have an emergency medical condition.  Admission to Facility-Based Crisis Unit for  observation, stabilization, and withdrawal monitoring.  CIWA and COWS protocols for withdrawal symptom monitoring; PRN medications per detox protocol.  Encourage rest, hydration, and nutrition during early stabilization phase.  Psychiatric medications: resumed at this time Safety: No SI/HI; continue routine safety checks.  Case Management / Discharge Planning: Assist with referral to residential substance use program or community recovery housing; provide linkage to outpatient psychiatry and therapy upon discharge.   Majel GORMAN Ramp, FNP 11/30/24  12:48 PM

## 2024-11-30 NOTE — [facility] Group Notes (Signed)
 Spiritual Care and Counseling Group Note  11/30/2024 2:45pm  Facilitated by: Librada Donnice Lin   Type of Therapy and Topic:  Hope    Participation Level:  Did Not Attend  Description of Group:  Group focused on topic of hope.  Patients participated in facilitated discussion around topic, connecting with one another around experiences and definitions for hope.  Group members engaged with group word cloud.  Members selected an image of what hope looks like for them today.  Group engaged in discussion around how their definitions of hope are present today in hospital.    Summary of Patient Progress:   DID NOT ATTEND  Therapeutic Modalities: Psycho-social ed, Adlerian, Narrative, MI   Librada Donnice Lin, Chaplain 11/30/2024 4:45 PM

## 2024-11-30 NOTE — Group Note (Signed)
 Group Topic: Social Support  Group Date: 11/30/2024 Start Time: 1200 End Time: 1230 Facilitators: Leron Charolette CROME, RN  Department: [facility]  Number of Participants: 1  Group Focus: check in Treatment Modality:  Individual Therapy Interventions utilized were patient education and support Purpose: express feelings and increase insight  Name: Lance Walters Date of Birth: September 29, 1956  MR: [id number]    Level of Participation: active Quality of Participation: cooperative and supportive Interactions with others: gave feedback Mood/Affect: appropriate Triggers (if applicable): none identified  Cognition: coherent/clear Progress: Moderate Response: Patient expressed understanding of medication regimen.  Plan: follow-up needed  Patients Problems:  Patient Active Problem List   Diagnosis Date Noted   Polysubstance abuse (HCC) 11/30/2024   Nicotine  dependence, cigarettes, uncomplicated 11/22/2023   Mixed hyperlipidemia 11/11/2023   Chest pain 11/10/2023   Hypokalemia 11/10/2023   Abnormal finding on echocardiogram 11/10/2023   Grade I diastolic dysfunction 11/10/2023   Polyp of colon 03/12/2022   Diverticulosis 03/12/2022   Incontinence of feces 12/14/2021   Left lower quadrant pain 12/14/2021   Pyuria 12/14/2021   Bright red stool 09/21/2021   Polyneuropathy, unspecified 02/04/2021   Abnormal renal function test 09/18/2020   Arthritis of right hip 09/18/2020   Neuropathy 09/18/2020   Spinal stenosis in cervical region 09/18/2020   Spinal stenosis of lumbosacral region 09/18/2020   Back pain 08/15/2020   Benign prostatic hyperplasia without lower urinary tract symptoms 08/15/2020   Cocaine use 08/15/2020   Essential hypertension 08/15/2020   Seizure disorder (HCC) 08/15/2020   Substance use disorder 08/15/2020

## 2024-11-30 NOTE — Care Management (Signed)
 [facility] Care Management.Scientist, Research (medical) met with the client to discuss discharge planning.  Writer informed the Client the average stay here is 3-5 days.   Client reports he last used prior to coming to the [facility].  Client reports he's been staying at [facility]) since he was released from prison on 10/24/24.  Client reports using crack cocaine, marijuana, and alcohol.  Client reports he's unsure how long they will hold his bed at [facility].    Writer will call [employer] and speak with Cathlyn to confirm this information.    Client is on parole and his P.O. contact information isDiplomatic Services Operational Officer Hickson: [phone].  Writer called and left his P.O. a HIPAA Compliant VM requesting a returned phone call.    Client is interested in local residential treatment due to his parole.  Writer sent referrals to [facility] and [facility]. Client has his own vehicle.

## 2024-11-30 NOTE — Group Note (Signed)
 Group Topic: Emotional Regulation  Group Date: 11/30/2024 Start Time: 1100 End Time: 1200 Facilitators: Lalanya Rufener, LCSW  Department: [facility]  Number of Participants: 3  Group Focus: chemical dependency education, communication, coping skills, depression, feeling awareness/expression, forgiveness, self-awareness, substance abuse education, and suicide prevention skills Treatment Modality:  Cognitive Behavioral Therapy, Psychoeducation, and Solution-Focused Therapy Interventions utilized were exploration, group exercise, patient education, and support Purpose: enhance coping skills, express feelings, express irrational fears, improve communication skills, regain self-worth, relapse prevention strategies, and trigger / craving management  Writer met with the group to complete a CBT activity called Guilt and Shame.  The activity explored levels of guilt and shamefulness they have experienced with mental health and or substance abuse.   Each participant explored relationships and self-worth tied to using substances and how it impacts them today.  The activity explored levels of guilt, feelings of weakness, and if they feel like a bad person.  If so, how can they turn these feelings into a positive meaning.  Group members identified triggers and coping skills while providing positive supportive feedback to other members.   Name: Lance Walters Date of Birth: 1956/11/12  MR: [id number]    Level of Participation: Client did not participate. He was meeting with staff.  Quality of Participation: N/A Interactions with others:  N/A Mood/Affect: N/A  Triggers (if applicable): N/a Cognition: N/A Progress: Other Response: N/a Plan: follow-up as needed  Patients Problems:  Patient Active Problem List   Diagnosis Date Noted   Polysubstance abuse (HCC) 11/30/2024   Nicotine  dependence, cigarettes, uncomplicated 11/22/2023   Mixed hyperlipidemia  11/11/2023   Chest pain 11/10/2023   Hypokalemia 11/10/2023   Abnormal finding on echocardiogram 11/10/2023   Grade I diastolic dysfunction 11/10/2023   Polyp of colon 03/12/2022   Diverticulosis 03/12/2022   Incontinence of feces 12/14/2021   Left lower quadrant pain 12/14/2021   Pyuria 12/14/2021   Bright red stool 09/21/2021   Polyneuropathy, unspecified 02/04/2021   Abnormal renal function test 09/18/2020   Arthritis of right hip 09/18/2020   Neuropathy 09/18/2020   Spinal stenosis in cervical region 09/18/2020   Spinal stenosis of lumbosacral region 09/18/2020   Back pain 08/15/2020   Benign prostatic hyperplasia without lower urinary tract symptoms 08/15/2020   Cocaine use 08/15/2020   Essential hypertension 08/15/2020   Seizure disorder (HCC) 08/15/2020   Substance use disorder 08/15/2020

## 2024-11-30 NOTE — ED Notes (Signed)
 Patient A&Ox4. Denies intent to harm self/others when asked. Denies SI/HI and A/VH. Patient denies any physical complaints when asked. No acute distress noted. Support and encouragement provided. Routine safety checks conducted according to facility protocol. Encouraged patient to notify staff if thoughts of harm toward self or others arise. Patient verbalize understanding and agreement. Will continue to monitor for safety.

## 2024-11-30 NOTE — Group Note (Signed)
 Group Topic: Recovery Basics  Group Date: 11/30/2024 Start Time: 8069 End Time: 2000 Facilitators: Verdon Jacqualyn BRAVO, NT  Department: [facility]  Number of Participants: 6  Group Focus: daily focus Treatment Modality:  Individual Therapy Interventions utilized were group exercise Purpose: express feelings  Name: Lance Walters Date of Birth: Oct 24, 1956  MR: [id number]    Level of Participation: active Quality of Participation: cooperative Interactions with others: gave feedback Mood/Affect: appropriate Triggers (if applicable): n/a Cognition: coherent/clear Progress: Moderate Response: n/a Plan: follow-up needed  Patients Problems:  Patient Active Problem List   Diagnosis Date Noted   Polysubstance abuse (HCC) 11/30/2024   Nicotine  dependence, cigarettes, uncomplicated 11/22/2023   Mixed hyperlipidemia 11/11/2023   Chest pain 11/10/2023   Hypokalemia 11/10/2023   Abnormal finding on echocardiogram 11/10/2023   Grade I diastolic dysfunction 11/10/2023   Polyp of colon 03/12/2022   Diverticulosis 03/12/2022   Incontinence of feces 12/14/2021   Left lower quadrant pain 12/14/2021   Pyuria 12/14/2021   Bright red stool 09/21/2021   Polyneuropathy, unspecified 02/04/2021   Abnormal renal function test 09/18/2020   Arthritis of right hip 09/18/2020   Neuropathy 09/18/2020   Spinal stenosis in cervical region 09/18/2020   Spinal stenosis of lumbosacral region 09/18/2020   Back pain 08/15/2020   Benign prostatic hyperplasia without lower urinary tract symptoms 08/15/2020   Cocaine use 08/15/2020   Essential hypertension 08/15/2020   Seizure disorder (HCC) 08/15/2020   Substance use disorder 08/15/2020

## 2024-11-30 NOTE — ED Notes (Signed)
 Patient is in the dayroom at the moment participating in Groups.  NAD, Denies needing anything.Environment secured per policy. Will monitor for safety.

## 2024-11-30 NOTE — Care Management (Addendum)
 [facility] Care Management...  Addendum 2:37 pm  Per [facility] Patient has been accepted to schedule and complete two part intake process  Writer coordinated and appointment for 12/06/2024 @ 9:00 am   Writer received return call from patients [employer]...  Per officer Durel it would be a good idea for patient to go to inpatient treatment.  Officer Durel stated that she has contacted [facility] on patients behalf to see what the process would be.   Officer Hickson asked that if patient gets accepted to [facility] can we transport him instead of patient driving himself. Just to ensure that patient arrives  Writer advised Durel of our process and referrals already sent.   Per Officer Hickson [facility] would be too far for patient to travel  Writer reached out to [facility] to confirm receipt of patients referral  Per Sonny @ [facility], referral received and patient needs to call to complete phone assessment  Writer provide patient with [facility] [phone] to complete assessment

## 2024-11-30 NOTE — ED Notes (Signed)
 Pt transferred to [facility] (Lance Walters). He's calm, cooperative, and pleasant. Pt is sleepy, falling asleep while trying to fill out consents, etc. Skin check performed, underwear and socks in washing machine. Pt oriented to his room and bathroom (too sleepy to orient to entire unit at this time). No reports of pain. Denies SI/HI/AVH.

## 2024-11-30 NOTE — ED Notes (Addendum)
 Patient is in the bedroom cam and sleeping. NAD. Will continue to monitor for safety. CIWA assessment not completed.

## 2024-12-01 DIAGNOSIS — F1990 Other psychoactive substance use, unspecified, uncomplicated: Secondary | ICD-10-CM | POA: Diagnosis not present

## 2024-12-01 DIAGNOSIS — F1910 Other psychoactive substance abuse, uncomplicated: Secondary | ICD-10-CM | POA: Diagnosis not present

## 2024-12-01 DIAGNOSIS — F321 Major depressive disorder, single episode, moderate: Secondary | ICD-10-CM | POA: Diagnosis not present

## 2024-12-01 MED ORDER — AMLODIPINE BESYLATE 5 MG PO TABS
5.0000 mg | ORAL_TABLET | Freq: Every day | ORAL | Status: DC
  Administered 2024-12-01 – 2024-12-04 (×4): 5 mg via ORAL
  Filled 2024-12-01: qty 1
  Filled 2024-12-01: qty 15
  Filled 2024-12-01 (×2): qty 1
  Filled 2024-12-01: qty 15
  Filled 2024-12-01: qty 1
  Filled 2024-12-01: qty 7
  Filled 2024-12-01: qty 15

## 2024-12-01 MED ORDER — GABAPENTIN 100 MG PO CAPS
100.0000 mg | ORAL_CAPSULE | Freq: Two times a day (BID) | ORAL | Status: DC
  Administered 2024-12-01 – 2024-12-04 (×8): 100 mg via ORAL
  Filled 2024-12-01 (×5): qty 1
  Filled 2024-12-01: qty 14
  Filled 2024-12-01: qty 1
  Filled 2024-12-01: qty 30
  Filled 2024-12-01 (×2): qty 1

## 2024-12-01 MED ORDER — FLUOXETINE HCL 10 MG PO CAPS
10.0000 mg | ORAL_CAPSULE | Freq: Every day | ORAL | Status: DC
  Administered 2024-12-01 – 2024-12-04 (×4): 10 mg via ORAL
  Filled 2024-12-01: qty 1
  Filled 2024-12-01: qty 7
  Filled 2024-12-01: qty 15
  Filled 2024-12-01 (×2): qty 1
  Filled 2024-12-01: qty 15
  Filled 2024-12-01: qty 1

## 2024-12-01 NOTE — Group Note (Signed)
 Group Topic: Communication  Group Date: 12/01/2024 Start Time: 0800 End Time: 0830 Facilitators: Judi Monico RAMAN, NT  Department: [facility]  Number of Participants: 8  Group Focus: check in and community group Treatment Modality:  Psychoeducation Interventions utilized were exploration Purpose: express feelings, express irrational fears, and increase insight  Name: Lance Walters Date of Birth: [birth year]  MR: [id number]    Level of Participation: minimal Quality of Participation: quiet Interactions with others: gave feedback Mood/Affect: flat Triggers (if applicable): n/a Cognition: coherent/clear Progress: Moderate Response: Pt was able to express needs for the day and expressed understanding of unit expectations. Plan: follow-up needed  Patients Problems:  Patient Active Problem List   Diagnosis Date Noted   Polysubstance abuse (HCC) 11/30/2024   Nicotine  dependence, cigarettes, uncomplicated 11/22/2023   Mixed hyperlipidemia 11/11/2023   Chest pain 11/10/2023   Hypokalemia 11/10/2023   Abnormal finding on echocardiogram 11/10/2023   Grade I diastolic dysfunction 11/10/2023   Polyp of colon 03/12/2022   Diverticulosis 03/12/2022   Incontinence of feces 12/14/2021   Left lower quadrant pain 12/14/2021   Pyuria 12/14/2021   Bright red stool 09/21/2021   Polyneuropathy, unspecified 02/04/2021   Abnormal renal function test 09/18/2020   Arthritis of right hip 09/18/2020   Neuropathy 09/18/2020   Spinal stenosis in cervical region 09/18/2020   Spinal stenosis of lumbosacral region 09/18/2020   Back pain 08/15/2020   Benign prostatic hyperplasia without lower urinary tract symptoms 08/15/2020   Cocaine use 08/15/2020   Essential hypertension 08/15/2020   Seizure disorder (HCC) 08/15/2020   Substance use disorder 08/15/2020

## 2024-12-01 NOTE — ED Notes (Signed)
 Assumed care of patient this am, he presented in good spirit, reported sleeping well last evening, denied thoughts, plan or intent to harm self. Pt stated he is having some tremors but an other w/d sx.

## 2024-12-01 NOTE — Group Note (Signed)
 Group Topic: Relaxation  Group Date: 12/01/2024 Start Time: 1500 End Time: 1515 Facilitators: Deidre Prentis CROME, NT  Department: [facility]  Number of Participants: 5  Group Focus: relaxation Treatment Modality:  Psychoeducation Interventions utilized were mental fitness Purpose: reinforce self-care  Name: Lance Walters Date of Birth: September 23, 1956  MR: [id number]    Level of Participation: Pt did not attend group Quality of Participation:  Patients Problems:  Patient Active Problem List   Diagnosis Date Noted   Polysubstance abuse (HCC) 11/30/2024   Nicotine  dependence, cigarettes, uncomplicated 11/22/2023   Mixed hyperlipidemia 11/11/2023   Chest pain 11/10/2023   Hypokalemia 11/10/2023   Abnormal finding on echocardiogram 11/10/2023   Grade I diastolic dysfunction 11/10/2023   Polyp of colon 03/12/2022   Diverticulosis 03/12/2022   Incontinence of feces 12/14/2021   Left lower quadrant pain 12/14/2021   Pyuria 12/14/2021   Bright red stool 09/21/2021   Polyneuropathy, unspecified 02/04/2021   Abnormal renal function test 09/18/2020   Arthritis of right hip 09/18/2020   Neuropathy 09/18/2020   Spinal stenosis in cervical region 09/18/2020   Spinal stenosis of lumbosacral region 09/18/2020   Back pain 08/15/2020   Benign prostatic hyperplasia without lower urinary tract symptoms 08/15/2020   Cocaine use 08/15/2020   Essential hypertension 08/15/2020   Seizure disorder (HCC) 08/15/2020   Substance use disorder 08/15/2020

## 2024-12-01 NOTE — ED Notes (Signed)
 Patient asleep in the bedroom. NAD, will keep monitoring for safety.

## 2024-12-01 NOTE — ED Provider Notes (Signed)
 Behavioral Health Progress Note  Date and Time: 12/01/2024 11:12 AM Name: Lance Walters MRN:  [id number]  Subjective:   [name] stated  I guess I may be going to [facility] on Monday  Evaluation: Kathleene Batter 68 year old male was seen and evaluated face-to-face by this provider.  Patient observed sitting in the day room.  He denied concerns related to suicidal or homicidal ideations.  Reports he was admitted due to drinking a lot of alcohol.  Reports struggling with alcohol abuse on and off for most of his life.  States he drinks roughly 1/2 gallon of liquor.  Reports he is currently homeless, has been residing at [facility].  Denied that he is followed by therapy or psychiatry currently.  Restarted on Prozac  10 mg.  Discussed increasing the patient's to help with mood stabilization.  Reports seeking residential treatment programming with [facility].  States he was previously at [facility] outside of [place] during his last residential treatment about  6 months prior.  Reports a good appetite.  States he is resting well throughout the night.  Support,  encouragement and  reassurance was provided.    HPI: per admission assessment note:Lance Walters is a 68 year-old male who presents to [facility] voluntarily, unaccompanied, reporting that he needs to detox from substances. Reports he was recently released from prison and was unable to maintain sobriety.  Reports using crack, Marijuana and alcohol on a daily basis. Last use was last night: Patient reports using 1/2 gallon of liquor, 12 beers, crack and Marijuana.   Diagnosis:  Final diagnoses:  Substance use disorder  Current moderate episode of major depressive disorder, unspecified whether recurrent (HCC)    Total Time spent with patient: 15 minutes  Past Psychiatric History: Documented history related to substance-induced mood disorder, posttraumatic stress disorder, anxiety and depression. Past Medical History: Hypertension,  nicotine  dependency diastolic dysfunction and makes hyperlipidemi, seizure disorder Family History: N/A Family Psychiatric  History: N/A Social History: Previous incarcerated currently residing in local homeless shelter denied that he is followed by therapy psychiatry currently  Additional Social History:                         Sleep: Fair  Appetite:  Fair  Current Medications:  Current Facility-Administered Medications  Medication Dose Route Frequency Provider Last Rate Last Admin   acetaminophen  (TYLENOL ) tablet 650 mg  650 mg Oral Q6H PRN Randall Starlyn HERO, NP       alum & mag hydroxide-simeth (MAALOX/MYLANTA) 200-200-20 MG/5ML suspension 30 mL  30 mL Oral Q4H PRN Randall, Veronique M, NP       amLODipine  (NORVASC ) tablet 5 mg  5 mg Oral Daily Fabian Walder N, NP       FLUoxetine  (PROZAC ) capsule 10 mg  10 mg Oral Daily Yuya Vanwingerden N, NP       gabapentin  (NEURONTIN ) capsule 100 mg  100 mg Oral BID Kian Gamarra N, NP       magnesium  hydroxide (MILK OF MAGNESIA) suspension 30 mL  30 mL Oral Daily PRN Randall, Veronique M, NP       OLANZapine  zydis (ZYPREXA ) disintegrating tablet 5 mg  5 mg Oral TID PRN Byungura, Veronique M, NP       traZODone  (DESYREL ) tablet 50 mg  50 mg Oral QHS PRN Byungura, Veronique M, NP   50 mg at 11/30/24 2116   Current Outpatient Medications  Medication Sig Dispense Refill   albuterol  (VENTOLIN  HFA) 108 (90 Base) MCG/ACT inhaler  Inhale 2 puffs into the lungs every 6 (six) hours as needed for wheezing or shortness of breath.     amantadine (SYMMETREL) 100 MG capsule Take 100 mg by mouth 2 (two) times daily.     amLODipine  (NORVASC ) 5 MG tablet Take 1 tablet (5 mg total) by mouth daily. 30 tablet 2   FLUoxetine  (PROZAC ) 20 MG capsule Take 20 mg by mouth every evening.     gabapentin  (NEURONTIN ) 100 MG capsule Take 100 mg by mouth 3 (three) times daily as needed (For pain).     INCRUSE ELLIPTA 62.5 MCG/ACT AEPB Inhale 1 puff into the lungs  daily.     levETIRAcetam  (KEPPRA ) 500 MG tablet Take 500 mg by mouth every 12 (twelve) hours.      Labs  Lab Results:  Admission on 11/29/2024, Discharged on 11/30/2024  Component Date Value Ref Range Status   WBC 11/30/2024 9.0  4.0 - 10.5 K/uL Final   RBC 11/30/2024 4.40  4.22 - 5.81 MIL/uL Final   Hemoglobin 11/30/2024 13.0  13.0 - 17.0 g/dL Final   HCT 87/94/7974 39.9  39.0 - 52.0 % Final   MCV 11/30/2024 90.7  80.0 - 100.0 fL Final   MCH 11/30/2024 29.5  26.0 - 34.0 pg Final   MCHC 11/30/2024 32.6  30.0 - 36.0 g/dL Final   RDW 87/94/7974 13.9  11.5 - 15.5 % Final   Platelets 11/30/2024 238  150 - 400 K/uL Final   nRBC 11/30/2024 0.0  0.0 - 0.2 % Final   Neutrophils Relative % 11/30/2024 55  % Final   Neutro Abs 11/30/2024 4.9  1.7 - 7.7 K/uL Final   Lymphocytes Relative 11/30/2024 33  % Final   Lymphs Abs 11/30/2024 3.0  0.7 - 4.0 K/uL Final   Monocytes Relative 11/30/2024 9  % Final   Monocytes Absolute 11/30/2024 0.8  0.1 - 1.0 K/uL Final   Eosinophils Relative 11/30/2024 2  % Final   Eosinophils Absolute 11/30/2024 0.2  0.0 - 0.5 K/uL Final   Basophils Relative 11/30/2024 1  % Final   Basophils Absolute 11/30/2024 0.1  0.0 - 0.1 K/uL Final   Immature Granulocytes 11/30/2024 0  % Final   Abs Immature Granulocytes 11/30/2024 0.04  0.00 - 0.07 K/uL Final   Performed at [facility], [address] [place], [place] [address]   Sodium 11/30/2024 141  135 - 145 mmol/L Final   Potassium 11/30/2024 4.4  3.5 - 5.1 mmol/L Final   Chloride 11/30/2024 109  98 - 111 mmol/L Final   CO2 11/30/2024 22  22 - 32 mmol/L Final   Glucose, Bld 11/30/2024 107 (H)  70 - 99 mg/dL Final   Glucose reference range applies only to samples taken after fasting for at least 8 hours.   BUN 11/30/2024 21  8 - 23 mg/dL Final   Creatinine, Ser 11/30/2024 1.31 (H)  0.61 - 1.24 mg/dL Final   Calcium 87/94/7974 8.6 (L)  8.9 - 10.3 mg/dL Final   Total Protein 87/94/7974 6.4 (L)  6.5 - 8.1 g/dL Final    Albumin 87/94/7974 3.0 (L)  3.5 - 5.0 g/dL Final   AST 87/94/7974 17  15 - 41 U/L Final   ALT 11/30/2024 12  0 - 44 U/L Final   Alkaline Phosphatase 11/30/2024 82  38 - 126 U/L Final   Total Bilirubin 11/30/2024 0.4  0.0 - 1.2 mg/dL Final   GFR, Estimated 11/30/2024 59 (L)  >60 mL/min Final   Comment: (  NOTE) Calculated using the CKD-EPI Creatinine Equation (2021)    Anion gap 11/30/2024 10  5 - 15 Final   Performed at [facility], [address], [place], [place] [address]   Hgb A1c MFr Bld 11/30/2024 5.3  4.8 - 5.6 % Final   Comment: (NOTE) Diagnosis of Diabetes The following HbA1c ranges recommended by the American Diabetes Association (ADA) may be used as an aid in the diagnosis of diabetes mellitus.  Hemoglobin             Suggested A1C NGSP%              Diagnosis  <5.7                   Non Diabetic  5.7-6.4                Pre-Diabetic  >6.4                   Diabetic  <7.0                   Glycemic control for                       adults with diabetes.     Mean Plasma Glucose 11/30/2024 105.41  mg/dL Final   Performed at [facility], [address] [place], [place] [address]   Magnesium  11/30/2024 2.2  1.7 - 2.4 mg/dL Final   Performed at [facility], [address], [place], [place] [address]   Alcohol, Ethyl (B) 11/30/2024 <15  <15 mg/dL Final   Comment: (NOTE) For medical purposes only. Performed at [facility], [address], [place], [place] [address]    Cholesterol 11/30/2024 155  0 - 200 mg/dL Final   Triglycerides 87/94/7974 53  <150 mg/dL Final   HDL 87/94/7974 46  >40 mg/dL Final   Total CHOL/HDL Ratio 11/30/2024 3.4  RATIO Final   VLDL 11/30/2024 11  0 - 40 mg/dL Final   LDL Cholesterol 11/30/2024 98  0 - 99 mg/dL Final   Comment:        Total Cholesterol/HDL:CHD Risk Coronary Heart Disease Risk Table                     Men   Women  1/2 Average Risk   3.4   3.3  Average Risk       5.0   4.4  2 X Average Risk    9.6   7.1  3 X Average Risk  23.4   11.0        Use the calculated Patient Ratio above and the CHD Risk Table to determine the patient's CHD Risk.        ATP III CLASSIFICATION (LDL):  <100     mg/dL   Optimal  899-870  mg/dL   Near or Above                    Optimal  130-159  mg/dL   Borderline  839-810  mg/dL   High  >809     mg/dL   Very High Performed at [facility], [address] [place], [place] [address]    TSH 11/30/2024 0.672  0.350 - 4.500 uIU/mL Final   Comment: Performed by a 3rd Generation assay with a functional sensitivity of <=0.01 uIU/mL. Performed at [facility],  [address], [place], [place] [address]    Color, Urine 11/29/2024 YELLOW  YELLOW Final   APPearance 11/29/2024 HAZY (A)  CLEAR Final   Specific Gravity, Urine 11/29/2024 1.031 (H)  1.005 - 1.030 Final   pH 11/29/2024 5.0  5.0 - 8.0 Final   Glucose, UA 11/29/2024 NEGATIVE  NEGATIVE mg/dL Final   Hgb urine dipstick 11/29/2024 SMALL (A)  NEGATIVE Final   Bilirubin Urine 11/29/2024 NEGATIVE  NEGATIVE Final   Ketones, ur 11/29/2024 NEGATIVE  NEGATIVE mg/dL Final   Protein, ur 87/95/7974 30 (A)  NEGATIVE mg/dL Final   Nitrite 87/95/7974 NEGATIVE  NEGATIVE Final   Leukocytes,Ua 11/29/2024 LARGE (A)  NEGATIVE Final   RBC / HPF 11/29/2024 21-50  0 - 5 RBC/hpf Final   WBC, UA 11/29/2024 >50  0 - 5 WBC/hpf Final   Bacteria, UA 11/29/2024 RARE (A)  NONE SEEN Final   Squamous Epithelial / HPF 11/29/2024 0-5  0 - 5 /HPF Final   WBC Clumps 11/29/2024 PRESENT   Final   Mucus 11/29/2024 PRESENT   Final   Performed at [facility], [address], [place], [place] [address]   POC Amphetamine UR 11/29/2024 None Detected  NONE DETECTED (Cut Off Level 1000 ng/mL) Final   POC Secobarbital (BAR) 11/29/2024 None Detected  NONE DETECTED (Cut Off Level 300 ng/mL) Final   POC Buprenorphine (BUP) 11/29/2024 None Detected  NONE DETECTED (Cut Off Level 10 ng/mL) Final   POC Oxazepam (BZO) 11/29/2024  None Detected  NONE DETECTED (Cut Off Level 300 ng/mL) Final   POC Cocaine UR 11/29/2024 Positive (A)  NONE DETECTED (Cut Off Level 300 ng/mL) Final   POC Methamphetamine UR 11/29/2024 None Detected  NONE DETECTED (Cut Off Level 1000 ng/mL) Final   POC Morphine 11/29/2024 None Detected  NONE DETECTED (Cut Off Level 300 ng/mL) Final   POC Methadone UR 11/29/2024 None Detected  NONE DETECTED (Cut Off Level 300 ng/mL) Final   POC Oxycodone UR 11/29/2024 None Detected  NONE DETECTED (Cut Off Level 100 ng/mL) Final   POC Marijuana UR 11/29/2024 Positive (A)  NONE DETECTED (Cut Off Level 50 ng/mL) Final    Blood Alcohol level:  Lab Results  Component Value Date   [facility] <15 11/30/2024    Metabolic Disorder Labs: Lab Results  Component Value Date   HGBA1C 5.3 11/30/2024   MPG 105.41 11/30/2024   No results found for: PROLACTIN Lab Results  Component Value Date   CHOL 155 11/30/2024   TRIG 53 11/30/2024   HDL 46 11/30/2024   CHOLHDL 3.4 11/30/2024   VLDL 11 11/30/2024   LDLCALC 98 11/30/2024    Therapeutic Lab Levels: No results found for: LITHIUM No results found for: VALPROATE No results found for: CBMZ  Physical Findings   PHQ2-9    Flowsheet Row ED from 11/30/2024 in [facility] ED from 11/29/2024 in [facility]  PHQ-2 Total Score 0 0   Flowsheet Row ED from 11/30/2024 in [facility] ED from 11/29/2024 in [facility] ED from 12/15/2023 in [facility] Emergency Department at [facility]  C-SSRS RISK CATEGORY No Risk No Risk No Risk     Musculoskeletal  Strength & Muscle Tone: within normal limits Gait & Station: normal Patient leans: N/A  Psychiatric Specialty Exam  Presentation  General Appearance:  Appropriate for Environment  Eye Contact: Fair  Speech: Clear and Coherent  Speech Volume: Normal  Handedness:  Right   Mood and  Affect  Mood: Euthymic  Affect: Congruent   Thought Process  Thought Processes: Coherent  Descriptions of Associations:Intact  Orientation:Full (Time, Place and Person)  Thought Content:Logical  Diagnosis of Schizophrenia or Schizoaffective disorder in past: No    Hallucinations:Hallucinations: None  Ideas of Reference:None  Suicidal Thoughts:Suicidal Thoughts: No  Homicidal Thoughts:Homicidal Thoughts: No   Sensorium  Memory: Immediate Fair; Recent Fair  Judgment: Fair  Insight: Fair   Art Therapist  Concentration: Good  Attention Span: Fair  Recall: Fair  Fund of Knowledge: Fair  Language: Good   Psychomotor Activity  Psychomotor Activity: Psychomotor Activity: Normal   Assets  Assets: Desire for Improvement   Sleep  Sleep: Sleep: Fair  Estimated Sleeping Duration (Last 24 Hours): 17.50-21.25 hours  Nutritional Assessment (For OBS and FBC admissions only) Has the patient had a weight loss or gain of 10 pounds or more in the last 3 months?: No Has the patient had a decrease in food intake/or appetite?: No Does the patient have dental problems?: No Does the patient have eating habits or behaviors that may be indicators of an eating disorder including binging or inducing vomiting?: No Has the patient recently lost weight without trying?: 0 Has the patient been eating poorly because of a decreased appetite?: 0 Malnutrition Screening Tool Score: 0    Physical Exam  Physical Exam ROS Blood pressure (!) 130/91, pulse 63, temperature 98.2 F (36.8 C), temperature source Oral, resp. rate 18, SpO2 96%. There is no height or weight on file to calculate BMI.  Treatment Plan Summary: Daily contact with patient to assess and evaluate symptoms and progress in treatment and Medication management  Continue with current treatment plan on 12/01/2024 as listed below except were noted  Substance-induced mood disorder: Generalized  anxiety disorder: Posttraumatic stress disorder: Hypertension/diastolic dysfunction  Restarted home medications with Norvasc  5 mg daily. Continue Prozac  10 mg daily Restarted home medications with gabapentin  100 mg p.o. twice daily Continue trazodone  50 mg daily p.o. nightly as needed  CSW to continue working on discharge disposition Patient encouraged to participate in therapeutic milieu  Staci LOISE Kerns, NP 12/01/2024 11:12 AM

## 2024-12-02 DIAGNOSIS — F1910 Other psychoactive substance abuse, uncomplicated: Secondary | ICD-10-CM | POA: Diagnosis not present

## 2024-12-02 NOTE — ED Notes (Signed)
 Patient is in the dayroom at the moment participating in Groups.  NAD, Denies needing anything.Stated feels safe and his focus is on being discharged Monday. Will continue to monitor and report changes a noted. Safety maintained

## 2024-12-02 NOTE — Group Note (Signed)
 Group Topic: Social Support  Group Date: 12/02/2024 Start Time: 1130 End Time: 1200 Facilitators: Leron Charolette CROME, RN  Department: [facility]  Number of Participants: 6  Group Focus: co-dependency, concentration, coping skills, and depression Treatment Modality:  Psychoeducation Interventions utilized were assignment, exploration, patient education, and reality testing Purpose: explore maladaptive thinking, express feelings, express irrational fears, improve communication skills, increase insight, regain self-worth, and relapse prevention strategies  Name: Lance Walters Date of Birth: 1956/03/29  MR: [id number]    Level of aation: minimal Quality of Participation: cooperative Interactions with others: gave feedback Mood/Affect: appropriate Triggers (if applicable): n/a Cognition: coherent/clear Progress: Moderate Response: good Plan: follow-up needed  Patients Problems:  Patient Active Problem List   Diagnosis Date Noted   Polysubstance abuse (HCC) 11/30/2024   Nicotine  dependence, cigarettes, uncomplicated 11/22/2023   Mixed hyperlipidemia 11/11/2023   Chest pain 11/10/2023   Hypokalemia 11/10/2023   Abnormal finding on echocardiogram 11/10/2023   Grade I diastolic dysfunction 11/10/2023   Polyp of colon 03/12/2022   Diverticulosis 03/12/2022   Incontinence of feces 12/14/2021   Left lower quadrant pain 12/14/2021   Pyuria 12/14/2021   Bright red stool 09/21/2021   Polyneuropathy, unspecified 02/04/2021   Abnormal renal function test 09/18/2020   Arthritis of right hip 09/18/2020   Neuropathy 09/18/2020   Spinal stenosis in cervical region 09/18/2020   Spinal stenosis of lumbosacral region 09/18/2020   Back pain 08/15/2020   Benign prostatic hyperplasia without lower urinary tract symptoms 08/15/2020   Cocaine use 08/15/2020   Essential hypertension 08/15/2020   Seizure disorder (HCC) 08/15/2020   Substance use disorder  08/15/2020

## 2024-12-02 NOTE — ED Notes (Signed)
 Paitent provided breakfast.

## 2024-12-02 NOTE — Group Note (Signed)
 Group Topic: Relapse and Recovery  Group Date: 12/02/2024 Start Time: 8069 End Time: 2000 Facilitators: Verdon Jacqualyn BRAVO, NT  Department: [facility]  Number of Participants: 8  Group Focus: chemical dependency issues Treatment Modality:  Patient-Centered Therapy Interventions utilized were group exercise Purpose: relapse prevention strategies  Name: Lance Walters Date of Birth: 07/01/1956  MR: [id number]    Level of Participation: active Quality of Participation: cooperative Interactions with others: gave feedback Mood/Affect: appropriate Triggers (if applicable): n/a Cognition: coherent/clear Progress: Moderate Response: n/a Plan: follow-up needed  Patients Problems:  Patient Active Problem List   Diagnosis Date Noted   Polysubstance abuse (HCC) 11/30/2024   Nicotine  dependence, cigarettes, uncomplicated 11/22/2023   Mixed hyperlipidemia 11/11/2023   Chest pain 11/10/2023   Hypokalemia 11/10/2023   Abnormal finding on echocardiogram 11/10/2023   Grade I diastolic dysfunction 11/10/2023   Polyp of colon 03/12/2022   Diverticulosis 03/12/2022   Incontinence of feces 12/14/2021   Left lower quadrant pain 12/14/2021   Pyuria 12/14/2021   Bright red stool 09/21/2021   Polyneuropathy, unspecified 02/04/2021   Abnormal renal function test 09/18/2020   Arthritis of right hip 09/18/2020   Neuropathy 09/18/2020   Spinal stenosis in cervical region 09/18/2020   Spinal stenosis of lumbosacral region 09/18/2020   Back pain 08/15/2020   Benign prostatic hyperplasia without lower urinary tract symptoms 08/15/2020   Cocaine use 08/15/2020   Essential hypertension 08/15/2020   Seizure disorder (HCC) 08/15/2020   Substance use disorder 08/15/2020

## 2024-12-02 NOTE — ED Notes (Signed)
 Patient is in the bedroom calm and composed.  Denies SI/HI/AVH. Respirations even and unlabored. Environment secured per policy. Will monitor for safety.

## 2024-12-02 NOTE — ED Notes (Signed)
 Pt sitting in dayroom watching television and interacting with peers. No acute distress noted. No concerns voiced. Informed pt to notify staff with any needs or assistance. Pt verbalized understanding and agreement. Will continue to monitor for safety.

## 2024-12-02 NOTE — ED Notes (Signed)
  Patient asleep in the bedroom. No issues through the night.  will keep monitoring for safety.

## 2024-12-02 NOTE — ED Notes (Signed)
 Paitent provided lunch.

## 2024-12-02 NOTE — ED Notes (Signed)
 Paitent provided dinner.

## 2024-12-02 NOTE — Group Note (Signed)
 Group Topic: Communication  Group Date: 12/02/2024 Start Time: 2000 End Time: 2100 Facilitators: Joan Plowman B  Department: [facility]  Number of Participants: 3  Group Focus: abuse issues, communication, coping skills, and depression Treatment Modality:  Individual Therapy Interventions utilized were leisure development Purpose: enhance coping skills, express feelings, increase insight, relapse prevention strategies, and trigger / craving management  Name: Lance Walters Date of Birth: 24-Oct-1956  MR: [id number]    Level of Participation: active Quality of Participation: attentive Interactions with others: gave feedback Mood/Affect: appropriate Triggers (if applicable): NA Cognition: coherent/clear Progress: Gaining insight Response: NA Plan: patient will be encouraged to keep going to groups  Patients Problems:  Patient Active Problem List   Diagnosis Date Noted   Polysubstance abuse (HCC) 11/30/2024   Nicotine  dependence, cigarettes, uncomplicated 11/22/2023   Mixed hyperlipidemia 11/11/2023   Chest pain 11/10/2023   Hypokalemia 11/10/2023   Abnormal finding on echocardiogram 11/10/2023   Grade I diastolic dysfunction 11/10/2023   Polyp of colon 03/12/2022   Diverticulosis 03/12/2022   Incontinence of feces 12/14/2021   Left lower quadrant pain 12/14/2021   Pyuria 12/14/2021   Bright red stool 09/21/2021   Polyneuropathy, unspecified 02/04/2021   Abnormal renal function test 09/18/2020   Arthritis of right hip 09/18/2020   Neuropathy 09/18/2020   Spinal stenosis in cervical region 09/18/2020   Spinal stenosis of lumbosacral region 09/18/2020   Back pain 08/15/2020   Benign prostatic hyperplasia without lower urinary tract symptoms 08/15/2020   Cocaine use 08/15/2020   Essential hypertension 08/15/2020   Seizure disorder (HCC) 08/15/2020   Substance use disorder 08/15/2020

## 2024-12-02 NOTE — ED Provider Notes (Addendum)
 Behavioral Health Progress Note  Date and Time: 12/02/2024 10:39 AM Name: Lance Walters MRN:  [id number]  Subjective:   Lance Walters stated  I am doing okay today.    Evaluation: Samantha Olivera 68 year old African-American male observed sitting in dayroom interacting with peers.  Continues to deny suicidal or homicidal ideations.  Denies auditory or visual hallucinations.  Ports overall his mood is stabilized reports he is looking forward to following up with [facility] for residential treatment.  Patient had concerns related to driving his personal vehicle to treatment facility. [name] reports he has attended group sessions since his admission.  Denied withdrawal symptoms and/or cravings currently.  Reports a good appetite.  States he is resting well throughout the night.  Case management to continue to follow for additional outpatient resources. Nursing staff has concerns related to elevated blood pressure 136/99 prior to hypertension medication.  Hypertension medications Norvasc  5 mg and recently restarted on 12/01/2024.  Patient denies headaches nausea, vomiting or dizziness.  No additional concerns noted throughout this assessment.  Support, encouragement reassurance was provided.   HPI: per admission assessment note:Lance Walters is a 68 year-old male who presents to [facility] voluntarily, unaccompanied, reporting that he needs to detox from substances. Reports he was recently released from prison and was unable to maintain sobriety.  Reports using crack, Marijuana and alcohol on a daily basis. Last use was last night: Patient reports using 1/2 gallon of liquor, 12 beers, crack and Marijuana.   Diagnosis:  Final diagnoses:  Substance use disorder  Current moderate episode of major depressive disorder, unspecified whether recurrent (HCC)    Total Time spent with patient: 15 minutes  Past Psychiatric History: Documented history related to substance-induced mood disorder, posttraumatic  stress disorder, anxiety and depression. Past Medical History: Hypertension, nicotine  dependency diastolic dysfunction and makes hyperlipidemi, seizure disorder Family History: N/A Family Psychiatric  History: N/A Social History: Previous incarcerated currently residing in local homeless shelter denied that he is followed by therapy psychiatry currently  Additional Social History:                         Sleep: Fair  Appetite:  Fair  Current Medications:  Current Facility-Administered Medications  Medication Dose Route Frequency Provider Last Rate Last Admin   acetaminophen  (TYLENOL ) tablet 650 mg  650 mg Oral Q6H PRN Randall Starlyn HERO, NP       alum & mag hydroxide-simeth (MAALOX/MYLANTA) 200-200-20 MG/5ML suspension 30 mL  30 mL Oral Q4H PRN Randall, Veronique M, NP       amLODipine  (NORVASC ) tablet 5 mg  5 mg Oral Daily Ezzard Staci SAILOR, NP   5 mg at 12/02/24 9092   FLUoxetine  (PROZAC ) capsule 10 mg  10 mg Oral Daily Mennie Spiller N, NP   10 mg at 12/02/24 9092   gabapentin  (NEURONTIN ) capsule 100 mg  100 mg Oral BID Jaedan Huttner N, NP   100 mg at 12/02/24 9092   magnesium  hydroxide (MILK OF MAGNESIA) suspension 30 mL  30 mL Oral Daily PRN Randall Starlyn HERO, NP       OLANZapine  zydis (ZYPREXA ) disintegrating tablet 5 mg  5 mg Oral TID PRN Byungura, Veronique M, NP       traZODone  (DESYREL ) tablet 50 mg  50 mg Oral QHS PRN Byungura, Veronique M, NP   50 mg at 12/01/24 2102   Current Outpatient Medications  Medication Sig Dispense Refill   albuterol  (VENTOLIN  HFA) 108 (90 Base) MCG/ACT inhaler Inhale 2  puffs into the lungs every 6 (six) hours as needed for wheezing or shortness of breath.     amantadine (SYMMETREL) 100 MG capsule Take 100 mg by mouth 2 (two) times daily.     amLODipine  (NORVASC ) 5 MG tablet Take 1 tablet (5 mg total) by mouth daily. 30 tablet 2   FLUoxetine  (PROZAC ) 20 MG capsule Take 20 mg by mouth every evening.     gabapentin  (NEURONTIN ) 100 MG  capsule Take 100 mg by mouth 3 (three) times daily as needed (For pain).     INCRUSE ELLIPTA 62.5 MCG/ACT AEPB Inhale 1 puff into the lungs daily.     levETIRAcetam  (KEPPRA ) 500 MG tablet Take 500 mg by mouth every 12 (twelve) hours.      Labs  Lab Results:  Admission on 11/29/2024, Discharged on 11/30/2024  Component Date Value Ref Range Status   WBC 11/30/2024 9.0  4.0 - 10.5 K/uL Final   RBC 11/30/2024 4.40  4.22 - 5.81 MIL/uL Final   Hemoglobin 11/30/2024 13.0  13.0 - 17.0 g/dL Final   HCT 87/94/7974 39.9  39.0 - 52.0 % Final   MCV 11/30/2024 90.7  80.0 - 100.0 fL Final   MCH 11/30/2024 29.5  26.0 - 34.0 pg Final   MCHC 11/30/2024 32.6  30.0 - 36.0 g/dL Final   RDW 87/94/7974 13.9  11.5 - 15.5 % Final   Platelets 11/30/2024 238  150 - 400 K/uL Final   nRBC 11/30/2024 0.0  0.0 - 0.2 % Final   Neutrophils Relative % 11/30/2024 55  % Final   Neutro Abs 11/30/2024 4.9  1.7 - 7.7 K/uL Final   Lymphocytes Relative 11/30/2024 33  % Final   Lymphs Abs 11/30/2024 3.0  0.7 - 4.0 K/uL Final   Monocytes Relative 11/30/2024 9  % Final   Monocytes Absolute 11/30/2024 0.8  0.1 - 1.0 K/uL Final   Eosinophils Relative 11/30/2024 2  % Final   Eosinophils Absolute 11/30/2024 0.2  0.0 - 0.5 K/uL Final   Basophils Relative 11/30/2024 1  % Final   Basophils Absolute 11/30/2024 0.1  0.0 - 0.1 K/uL Final   Immature Granulocytes 11/30/2024 0  % Final   Abs Immature Granulocytes 11/30/2024 0.04  0.00 - 0.07 K/uL Final   Performed at [facility], [address], [place], [place] [address]   Sodium 11/30/2024 141  135 - 145 mmol/L Final   Potassium 11/30/2024 4.4  3.5 - 5.1 mmol/L Final   Chloride 11/30/2024 109  98 - 111 mmol/L Final   CO2 11/30/2024 22  22 - 32 mmol/L Final   Glucose, Bld 11/30/2024 107 (H)  70 - 99 mg/dL Final   Glucose reference range applies only to samples taken after fasting for at least 8 hours.   BUN 11/30/2024 21  8 - 23 mg/dL Final   Creatinine, Ser 11/30/2024 1.31  (H)  0.61 - 1.24 mg/dL Final   Calcium 87/94/7974 8.6 (L)  8.9 - 10.3 mg/dL Final   Total Protein 87/94/7974 6.4 (L)  6.5 - 8.1 g/dL Final   Albumin 87/94/7974 3.0 (L)  3.5 - 5.0 g/dL Final   AST 87/94/7974 17  15 - 41 U/L Final   ALT 11/30/2024 12  0 - 44 U/L Final   Alkaline Phosphatase 11/30/2024 82  38 - 126 U/L Final   Total Bilirubin 11/30/2024 0.4  0.0 - 1.2 mg/dL Final   GFR, Estimated 11/30/2024 59 (L)  >60 mL/min Final   Comment: (NOTE) Calculated  using the CKD-EPI Creatinine Equation (2021)    Anion gap 11/30/2024 10  5 - 15 Final   Performed at [facility], [address], [place], [place] [address]   Hgb A1c MFr Bld 11/30/2024 5.3  4.8 - 5.6 % Final   Comment: (NOTE) Diagnosis of Diabetes The following HbA1c ranges recommended by the American Diabetes Association (ADA) may be used as an aid in the diagnosis of diabetes mellitus.  Hemoglobin             Suggested A1C NGSP%              Diagnosis  <5.7                   Non Diabetic  5.7-6.4                Pre-Diabetic  >6.4                   Diabetic  <7.0                   Glycemic control for                       adults with diabetes.     Mean Plasma Glucose 11/30/2024 105.41  mg/dL Final   Performed at [facility], [address], [place], [place] [address]   Magnesium  11/30/2024 2.2  1.7 - 2.4 mg/dL Final   Performed at [facility], [address], [place], [place] [address]   Alcohol, Ethyl (B) 11/30/2024 <15  <15 mg/dL Final   Comment: (NOTE) For medical purposes only. Performed at [facility], [address], [place], [place] [address]    Cholesterol 11/30/2024 155  0 - 200 mg/dL Final   Triglycerides 87/94/7974 53  <150 mg/dL Final   HDL 87/94/7974 46  >40 mg/dL Final   Total CHOL/HDL Ratio 11/30/2024 3.4  RATIO Final   VLDL 11/30/2024 11  0 - 40 mg/dL Final   LDL Cholesterol 11/30/2024 98  0 - 99 mg/dL Final   Comment:        Total Cholesterol/HDL:CHD Risk Coronary  Heart Disease Risk Table                     Men   Women  1/2 Average Risk   3.4   3.3  Average Risk       5.0   4.4  2 X Average Risk   9.6   7.1  3 X Average Risk  23.4   11.0        Use the calculated Patient Ratio above and the CHD Risk Table to determine the patient's CHD Risk.        ATP III CLASSIFICATION (LDL):  <100     mg/dL   Optimal  899-870  mg/dL   Near or Above                    Optimal  130-159  mg/dL   Borderline  839-810  mg/dL   High  >809     mg/dL   Very High Performed at [facility], [address], [place], [place] [address]    TSH 11/30/2024 0.672  0.350 - 4.500 uIU/mL Final   Comment: Performed by a 3rd Generation assay with a functional sensitivity of <=0.01 uIU/mL. Performed at [facility], [address] [place], [place] [address]    Color, Urine 11/29/2024 YELLOW  YELLOW Final   APPearance 11/29/2024 HAZY (A)  CLEAR Final   Specific Gravity, Urine 11/29/2024 1.031 (H)  1.005 - 1.030 Final   pH 11/29/2024 5.0  5.0 - 8.0 Final   Glucose, UA 11/29/2024 NEGATIVE  NEGATIVE mg/dL Final   Hgb urine dipstick 11/29/2024 SMALL (A)  NEGATIVE Final   Bilirubin Urine 11/29/2024 NEGATIVE  NEGATIVE Final   Ketones, ur 11/29/2024 NEGATIVE  NEGATIVE mg/dL Final   Protein, ur 87/95/7974 30 (A)  NEGATIVE mg/dL Final   Nitrite 87/95/7974 NEGATIVE  NEGATIVE Final   Leukocytes,Ua 11/29/2024 LARGE (A)  NEGATIVE Final   RBC / HPF 11/29/2024 21-50  0 - 5 RBC/hpf Final   WBC, UA 11/29/2024 >50  0 - 5 WBC/hpf Final   Bacteria, UA 11/29/2024 RARE (A)  NONE SEEN Final   Squamous Epithelial / HPF 11/29/2024 0-5  0 - 5 /HPF Final   WBC Clumps 11/29/2024 PRESENT   Final   Mucus 11/29/2024 PRESENT   Final   Performed at [facility], [address] [place], [place] [address]   POC Amphetamine UR 11/29/2024 None Detected  NONE DETECTED (Cut Off Level 1000 ng/mL) Final   POC Secobarbital (BAR) 11/29/2024 None Detected  NONE DETECTED (Cut Off Level 300  ng/mL) Final   POC Buprenorphine (BUP) 11/29/2024 None Detected  NONE DETECTED (Cut Off Level 10 ng/mL) Final   POC Oxazepam (BZO) 11/29/2024 None Detected  NONE DETECTED (Cut Off Level 300 ng/mL) Final   POC Cocaine UR 11/29/2024 Positive (A)  NONE DETECTED (Cut Off Level 300 ng/mL) Final   POC Methamphetamine UR 11/29/2024 None Detected  NONE DETECTED (Cut Off Level 1000 ng/mL) Final   POC Morphine 11/29/2024 None Detected  NONE DETECTED (Cut Off Level 300 ng/mL) Final   POC Methadone UR 11/29/2024 None Detected  NONE DETECTED (Cut Off Level 300 ng/mL) Final   POC Oxycodone UR 11/29/2024 None Detected  NONE DETECTED (Cut Off Level 100 ng/mL) Final   POC Marijuana UR 11/29/2024 Positive (A)  NONE DETECTED (Cut Off Level 50 ng/mL) Final    Blood Alcohol level:  Lab Results  Component Value Date   [facility] <15 11/30/2024    Metabolic Disorder Labs: Lab Results  Component Value Date   HGBA1C 5.3 11/30/2024   MPG 105.41 11/30/2024   No results found for: PROLACTIN Lab Results  Component Value Date   CHOL 155 11/30/2024   TRIG 53 11/30/2024   HDL 46 11/30/2024   CHOLHDL 3.4 11/30/2024   VLDL 11 11/30/2024   LDLCALC 98 11/30/2024    Therapeutic Lab Levels: No results found for: LITHIUM No results found for: VALPROATE No results found for: CBMZ  Physical Findings   PHQ2-9    Flowsheet Row ED from 11/30/2024 in [facility] ED from 11/29/2024 in [facility]  PHQ-2 Total Score 0 0   Flowsheet Row ED from 11/30/2024 in [facility] ED from 11/29/2024 in [facility] ED from 12/15/2023 in [facility] at [facility]  C-SSRS RISK CATEGORY No Risk No Risk No Risk     Musculoskeletal  Strength & Muscle Tone: within normal limits Gait & Station: normal Patient leans: N/A  Psychiatric Specialty Exam  Presentation  General Appearance:   Appropriate for Environment  Eye Contact: None  Speech: Clear and Coherent  Speech Volume: Normal  Handedness: Right  Mood and Affect  Mood: Euthymic  Affect: Congruent   Thought Process  Thought Processes: Coherent  Descriptions of Associations:Intact  Orientation:Full (Time, Place and Person)  Thought Content:Logical  Diagnosis of Schizophrenia or Schizoaffective disorder in past: No    Hallucinations:Hallucinations: None  Ideas of Reference:None  Suicidal Thoughts:Suicidal Thoughts: No  Homicidal Thoughts:Homicidal Thoughts: No   Sensorium  Memory: Immediate Good; Recent Good  Judgment: Fair  Insight: Good   Executive Functions  Concentration: Good  Attention Span: Good  Recall: Good  Fund of Knowledge: Good  Language: Fair   Psychomotor Activity  Psychomotor Activity: Psychomotor Activity: Normal   Assets  Assets: Desire for Improvement; Communication Skills   Sleep  Sleep: Sleep: Fair  Estimated Sleeping Duration (Last 24 Hours): 14.25-16.25 hours  Nutritional Assessment (For OBS and [facility] admissions only) Has the patient had a weight loss or gain of 10 pounds or more in the last 3 months?: No Has the patient had a decrease in food intake/or appetite?: No Does the patient have dental problems?: No Does the patient have eating habits or behaviors that may be indicators of an eating disorder including binging or inducing vomiting?: No Has the patient recently lost weight without trying?: 0 Has the patient been eating poorly because of a decreased appetite?: 0 Malnutrition Screening Tool Score: 0    Physical Exam  Physical Exam Vitals and nursing note reviewed.  Neurological:     Mental Status: He is oriented to person, place, and time.  Psychiatric:        Mood and Affect: Mood normal.    ROS Blood pressure (!) 136/96, pulse 62, temperature 97.9 F (36.6 C), temperature source Oral, resp. rate 17, SpO2  100%. There is no height or weight on file to calculate BMI.  Treatment Plan Summary: Daily contact with patient to assess and evaluate symptoms and progress in treatment and Medication management  Continue with current treatment plan on 12/02/2024 as listed below except were noted  Substance-induced mood disorder: Generalized anxiety disorder: Posttraumatic stress disorder: Hypertension/diastolic dysfunction  Continue Norvasc  5 mg daily. Continue Prozac  10 mg daily Continue gabapentin  100 mg p.o. twice daily Continue trazodone  50 mg daily p.o. nightly as needed  CSW to continue working on discharge disposition Patient encouraged to participate in therapeutic milieu  Staci LOISE Kerns, NP 12/02/2024 10:39 AM

## 2024-12-03 DIAGNOSIS — F1990 Other psychoactive substance use, unspecified, uncomplicated: Secondary | ICD-10-CM | POA: Diagnosis not present

## 2024-12-03 DIAGNOSIS — F321 Major depressive disorder, single episode, moderate: Secondary | ICD-10-CM | POA: Diagnosis not present

## 2024-12-03 DIAGNOSIS — F1910 Other psychoactive substance abuse, uncomplicated: Secondary | ICD-10-CM | POA: Diagnosis not present

## 2024-12-03 NOTE — Group Note (Signed)
 Group Topic: Relapse and Recovery  Group Date: 12/03/2024 Start Time: 1000 End Time: 1100 Facilitators: Lonzell Dwayne RAMAN, NT  Department: [facility]  Number of Participants: 4  Group Focus: acceptance, chemical dependency education, chemical dependency issues, and clarity of thought Treatment Modality:  Behavior Modification Therapy, Cognitive Behavioral Therapy, Individual Therapy, Psychoeducation, and Solution-Focused Therapy Interventions utilized were confrontation, mental fitness, patient education, and problem solving Purpose: enhance coping skills, explore maladaptive thinking, express irrational fears, increase insight, regain self-worth, reinforce self-care, and relapse prevention strategies  Name: Lance Walters Date of Birth: 24-Jan-1956  MR: [id number]    Level of Participation: Patient attended group Quality of Participation: attentive Interactions with others: gave feedback Mood/Affect: positive Triggers (if applicable): N/A Cognition: coherent/clear Progress: Significant Response: appropriate  Plan: follow-up needed  Patients Problems:  Patient Active Problem List   Diagnosis Date Noted   Polysubstance abuse (HCC) 11/30/2024   Nicotine  dependence, cigarettes, uncomplicated 11/22/2023   Mixed hyperlipidemia 11/11/2023   Chest pain 11/10/2023   Hypokalemia 11/10/2023   Abnormal finding on echocardiogram 11/10/2023   Grade I diastolic dysfunction 11/10/2023   Polyp of colon 03/12/2022   Diverticulosis 03/12/2022   Incontinence of feces 12/14/2021   Left lower quadrant pain 12/14/2021   Pyuria 12/14/2021   Bright red stool 09/21/2021   Polyneuropathy, unspecified 02/04/2021   Abnormal renal function test 09/18/2020   Arthritis of right hip 09/18/2020   Neuropathy 09/18/2020   Spinal stenosis in cervical region 09/18/2020   Spinal stenosis of lumbosacral region 09/18/2020   Back pain 08/15/2020   Benign prostatic  hyperplasia without lower urinary tract symptoms 08/15/2020   Cocaine use 08/15/2020   Essential hypertension 08/15/2020   Seizure disorder (HCC) 08/15/2020   Substance use disorder 08/15/2020

## 2024-12-03 NOTE — Care Management (Signed)
 [facility] Care Management...  Writer met with the client to complete discharge planning.   Client has been accepted into [facility] on 12/06/24.    A referral was sent to [facility] on Friday.  Client completed a phone assessment with [facility] this morning.  Client should be notified today if he's been accepted or not.    Client will follow up with [facility] to see if they are still holding his bed.  Client reports living at the [facility] since he was released from prison in October.

## 2024-12-03 NOTE — ED Notes (Signed)
 Pt received awake alert and oriented x 2 offering occasional eye contact.  Affect flat. Answers questions asked appropriately. Denies thoughts for self harm or SI.  Safety maintained.

## 2024-12-03 NOTE — ED Notes (Signed)
 Pt is resting quietly, without significant complaints of pain or discomfort.  In no apparent distress.  Safety maintained.

## 2024-12-03 NOTE — Care Plan (Addendum)
 Interdisciplinary Treatment and Diagnostic Plan Update  12/03/2024 Time of Session: 2pm Lance Walters MRN: [id number]  Diagnosis:  Final diagnoses:  Substance use disorder  Current moderate episode of major depressive disorder, unspecified whether recurrent (HCC)     Current Medications:  Current Facility-Administered Medications  Medication Dose Route Frequency Provider Last Rate Last Admin   acetaminophen  (TYLENOL ) tablet 650 mg  650 mg Oral Q6H PRN Randall Starlyn HERO, NP       alum & mag hydroxide-simeth (MAALOX/MYLANTA) 200-200-20 MG/5ML suspension 30 mL  30 mL Oral Q4H PRN Randall, Veronique M, NP       amLODipine  (NORVASC ) tablet 5 mg  5 mg Oral Daily Lewis, Tanika N, NP   5 mg at 12/03/24 9096   FLUoxetine  (PROZAC ) capsule 10 mg  10 mg Oral Daily Lewis, Tanika N, NP   10 mg at 12/03/24 9096   gabapentin  (NEURONTIN ) capsule 100 mg  100 mg Oral BID Lewis, Tanika N, NP   100 mg at 12/03/24 0903   magnesium  hydroxide (MILK OF MAGNESIA) suspension 30 mL  30 mL Oral Daily PRN Randall Starlyn HERO, NP       OLANZapine  zydis (ZYPREXA ) disintegrating tablet 5 mg  5 mg Oral TID PRN Byungura, Veronique M, NP       traZODone  (DESYREL ) tablet 50 mg  50 mg Oral QHS PRN Byungura, Veronique M, NP   50 mg at 12/02/24 2102   Current Outpatient Medications  Medication Sig Dispense Refill   albuterol  (VENTOLIN  HFA) 108 (90 Base) MCG/ACT inhaler Inhale 2 puffs into the lungs every 6 (six) hours as needed for wheezing or shortness of breath.     amantadine (SYMMETREL) 100 MG capsule Take 100 mg by mouth 2 (two) times daily.     amLODipine  (NORVASC ) 5 MG tablet Take 1 tablet (5 mg total) by mouth daily. 30 tablet 2   FLUoxetine  (PROZAC ) 20 MG capsule Take 20 mg by mouth every evening.     gabapentin  (NEURONTIN ) 100 MG capsule Take 100 mg by mouth 3 (three) times daily as needed (For pain).     INCRUSE ELLIPTA 62.5 MCG/ACT AEPB Inhale 1 puff into the lungs daily.     levETIRAcetam   (KEPPRA ) 500 MG tablet Take 500 mg by mouth every 12 (twelve) hours.     PTA Medications: Prior to Admission medications   Medication Sig Start Date End Date Taking? Authorizing Provider  albuterol  (VENTOLIN  HFA) 108 (90 Base) MCG/ACT inhaler Inhale 2 puffs into the lungs every 6 (six) hours as needed for wheezing or shortness of breath. 11/08/24  Yes [provider]  amantadine (SYMMETREL) 100 MG capsule Take 100 mg by mouth 2 (two) times daily.   Yes [provider]  amLODipine  (NORVASC ) 5 MG tablet Take 1 tablet (5 mg total) by mouth daily. 11/13/23  Yes Shalhoub, Zachary PARAS, MD  FLUoxetine  (PROZAC ) 20 MG capsule Take 20 mg by mouth every evening.   Yes [provider]  gabapentin  (NEURONTIN ) 100 MG capsule Take 100 mg by mouth 3 (three) times daily as needed (For pain).   Yes [provider]  INCRUSE ELLIPTA 62.5 MCG/ACT AEPB Inhale 1 puff into the lungs daily. 11/08/24  Yes [provider]  levETIRAcetam  (KEPPRA ) 500 MG tablet Take 500 mg by mouth every 12 (twelve) hours.   Yes [provider]    Patient Stressors: Financial difficulties   Substance abuse    Patient Strengths: Ability for insight  Communication skills  Motivation for treatment/growth  Treatment Modalities: Medication Management, Group therapy, Case management,  1 to 1 session with clinician, Psychoeducation, Recreational therapy.   Physician Treatment Plan for Primary and Secondary Diagnosis:  Final diagnoses:  Substance use disorder  Current moderate episode of major depressive disorder, unspecified whether recurrent (HCC)   Long Term Goal(s): Improvement in symptoms so as ready for discharge  Short Term Goals: Patient will verbalize feelings in meetings with treatment team members. Patient will attend at least of 50% of the groups daily. Pt will complete the PHQ9 on admission, day 3 and discharge. Patient will participate in completing the [place] Suicide  Severity Rating Scale Patient will score a low risk of violence for 24 hours prior to discharge Patient will take medications as prescribed daily.  Medication Management: Evaluate patient's response, side effects, and tolerance of medication regimen.  Therapeutic Interventions: 1 to 1 sessions, Unit Group sessions and Medication administration.  Evaluation of Outcomes: Progressing  LCSW Treatment Plan for Primary Diagnosis:  Final diagnoses:  Substance use disorder  Current moderate episode of major depressive disorder, unspecified whether recurrent (HCC)    Long Term Goal(s): Safe transition to appropriate next level of care at discharge.  Short Term Goals: Facilitate acceptance of mental health diagnosis and concerns through verbal commitment to aftercare plan and appointments at discharge. and Increase skills for wellness and recovery by attending 50% of scheduled groups.  Therapeutic Interventions: Assess for all discharge needs, 1 to 1 time with Child psychotherapist, Explore available resources and support systems, Assess for adequacy in community support network, Educate family and significant other(s) on suicide prevention, Complete Psychosocial Assessment, Interpersonal group therapy.  Evaluation of Outcomes: Progressing   Progress in Treatment: Attending groups: Yes. Participating in groups: Yes. Taking medication as prescribed: Yes. Toleration medication: Yes. Family/Significant other contact made: Yes, individual(s) contacted:    Patient understands diagnosis: Yes. Discussing patient identified problems/goals with staff: Yes. Medical problems stabilized or resolved: Yes. Denies suicidal/homicidal ideation: Yes. Issues/concerns per patient self-inventory: No. Other:  Client is open and communicates all needs and concerns as needed.  Client has met with child psychotherapist, nursing, and Doctors as needed.   New problem(s) identified: None  New Short Term/Long Term Goal(s): Client  will attend residential treatment to gain sobriety and to establish aftercare with a provider.   Patient Goals:  Client will attend [facility] on 12/06/24  Discharge Plan or Barriers: Client has identified no barriers and has communicated with P.O. as needed regarding his treatment.   Reason for Continuation of Hospitalization: Other; describe  Client will remain at [facility] so he can transition into [facility] ton 12/06/24 to avoid a relapse.  Writer has contacted [employer] several times to check on the status of his bed and no one has returned a call.  Client has 9 months left on parole.  Providers are continuing to monitor his status regarding dextox/health.    Estimated Length of Stay: 6 days.  Client will discharge on 12/06/24 to [facility].  Last 3 [place] Suicide Severity Risk Score: Flowsheet Row ED from 11/30/2024 in [facility] ED from 11/29/2024 in [facility] ED from 12/15/2023 in [facility] Emergency Department at [facility]  C-SSRS RISK CATEGORY No Risk No Risk No Risk    Last PHQ 2/9 Scores:    11/30/2024   12:25 PM 11/29/2024   11:22 PM  Depression screen PHQ 2/9  Decreased Interest 0 0  Down, Depressed, Hopeless 0 0  PHQ - 2 Score  0 0    Scribe for Treatment Team: Zimir Kittleson, LCSW 12/03/2024 2:11 PM

## 2024-12-03 NOTE — ED Provider Notes (Signed)
 Behavioral Health Progress Note  Date and Time: 12/03/2024 6:34 PM Name: Lance Walters MRN:  [id number]  Subjective:  I feel good.  HPI: per admission assessment note:Lance Walters is a 68 year-old male who presents to [facility] voluntarily, unaccompanied, reporting that he needs to detox from substances. Reports he was recently released from prison and was unable to maintain sobriety.  Reports using crack, Marijuana and alcohol on a daily basis. Last use was last night: Patient reports using 1/2 gallon of liquor, 12 beers, crack and Marijuana.    Assessment on 12/03/24:  Pt was laying in bed on approach. Pt reported that he slept ok last night. Pt reports no pan or withdrawal symptoms. Pt denies SI/HI (plan and intent), AVH and paranoia. Pt also did not endorse depressive symptoms or anxiety. Pt states, I'm cool as a cucumber, referring to him being calm and feeling good. Pt reported that he plans to continue to work on his sobriety and stated that he has been accepted to [facility]. Pt also reports that he wants to also work on getting into a [employer] [facility] when he completes the resident treatment. Program. Pt provided with support and encouragement.  Addendum: Tentative discharge date for patient is 12/11, so as to enhance a door-to-door transfer to rehabilitation so as to mitigate weeks of relapse. Denies concerns related to medications at this time and we will continue medications as currently listed.   Addendum by D. Tytianna Greenley, NP. Original note by A. Myra, NP student.  Diagnosis:  Final diagnoses:  Substance use disorder  Current moderate episode of major depressive disorder, unspecified whether recurrent (HCC)   Total Time spent with patient: 45 minutes  Past Psychiatric History: Documented history related to substance-induced mood disorder, posttraumatic stress disorder, anxiety and depression.  Past Medical History: Hypertension,  nicotine  dependency diastolic dysfunction and makes hyperlipidemi, seizure disorder  Family History: None reported Family Psychiatric  History: None reported Social History: Hypertension, nicotine  dependency diastolic dysfunction and makes hyperlipidemi, seizure disorder     Sleep: Good  Appetite:  Good  Current Medications:  Current Facility-Administered Medications  Medication Dose Route Frequency Provider Last Rate Last Admin   acetaminophen  (TYLENOL ) tablet 650 mg  650 mg Oral Q6H PRN Randall Starlyn HERO, NP       alum & mag hydroxide-simeth (MAALOX/MYLANTA) 200-200-20 MG/5ML suspension 30 mL  30 mL Oral Q4H PRN Randall, Veronique M, NP       amLODipine  (NORVASC ) tablet 5 mg  5 mg Oral Daily Lewis, Tanika N, NP   5 mg at 12/03/24 9096   FLUoxetine  (PROZAC ) capsule 10 mg  10 mg Oral Daily Lewis, Tanika N, NP   10 mg at 12/03/24 9096   gabapentin  (NEURONTIN ) capsule 100 mg  100 mg Oral BID Lewis, Tanika N, NP   100 mg at 12/03/24 9096   magnesium  hydroxide (MILK OF MAGNESIA) suspension 30 mL  30 mL Oral Daily PRN Randall Starlyn HERO, NP       OLANZapine  zydis (ZYPREXA ) disintegrating tablet 5 mg  5 mg Oral TID PRN Randall Starlyn HERO, NP       traZODone  (DESYREL ) tablet 50 mg  50 mg Oral QHS PRN Byungura, Veronique M, NP   50 mg at 12/02/24 2102   Current Outpatient Medications  Medication Sig Dispense Refill   albuterol  (VENTOLIN  HFA) 108 (90 Base) MCG/ACT inhaler Inhale 2 puffs into the lungs every 6 (six) hours as needed for wheezing or shortness of breath.  amantadine (SYMMETREL) 100 MG capsule Take 100 mg by mouth 2 (two) times daily.     amLODipine  (NORVASC ) 5 MG tablet Take 1 tablet (5 mg total) by mouth daily. 30 tablet 2   FLUoxetine  (PROZAC ) 20 MG capsule Take 20 mg by mouth every evening.     gabapentin  (NEURONTIN ) 100 MG capsule Take 100 mg by mouth 3 (three) times daily as needed (For pain).     INCRUSE ELLIPTA 62.5 MCG/ACT AEPB Inhale 1 puff into the lungs  daily.     levETIRAcetam  (KEPPRA ) 500 MG tablet Take 500 mg by mouth every 12 (twelve) hours.      Labs  Lab Results:  Admission on 11/29/2024, Discharged on 11/30/2024  Component Date Value Ref Range Status   WBC 11/30/2024 9.0  4.0 - 10.5 K/uL Final   RBC 11/30/2024 4.40  4.22 - 5.81 MIL/uL Final   Hemoglobin 11/30/2024 13.0  13.0 - 17.0 g/dL Final   HCT 87/94/7974 39.9  39.0 - 52.0 % Final   MCV 11/30/2024 90.7  80.0 - 100.0 fL Final   MCH 11/30/2024 29.5  26.0 - 34.0 pg Final   MCHC 11/30/2024 32.6  30.0 - 36.0 g/dL Final   RDW 87/94/7974 13.9  11.5 - 15.5 % Final   Platelets 11/30/2024 238  150 - 400 K/uL Final   nRBC 11/30/2024 0.0  0.0 - 0.2 % Final   Neutrophils Relative % 11/30/2024 55  % Final   Neutro Abs 11/30/2024 4.9  1.7 - 7.7 K/uL Final   Lymphocytes Relative 11/30/2024 33  % Final   Lymphs Abs 11/30/2024 3.0  0.7 - 4.0 K/uL Final   Monocytes Relative 11/30/2024 9  % Final   Monocytes Absolute 11/30/2024 0.8  0.1 - 1.0 K/uL Final   Eosinophils Relative 11/30/2024 2  % Final   Eosinophils Absolute 11/30/2024 0.2  0.0 - 0.5 K/uL Final   Basophils Relative 11/30/2024 1  % Final   Basophils Absolute 11/30/2024 0.1  0.0 - 0.1 K/uL Final   Immature Granulocytes 11/30/2024 0  % Final   Abs Immature Granulocytes 11/30/2024 0.04  0.00 - 0.07 K/uL Final   Performed at [facility], [address], [place], [place] [address]   Sodium 11/30/2024 141  135 - 145 mmol/L Final   Potassium 11/30/2024 4.4  3.5 - 5.1 mmol/L Final   Chloride 11/30/2024 109  98 - 111 mmol/L Final   CO2 11/30/2024 22  22 - 32 mmol/L Final   Glucose, Bld 11/30/2024 107 (H)  70 - 99 mg/dL Final   Glucose reference range applies only to samples taken after fasting for at least 8 hours.   BUN 11/30/2024 21  8 - 23 mg/dL Final   Creatinine, Ser 11/30/2024 1.31 (H)  0.61 - 1.24 mg/dL Final   Calcium 87/94/7974 8.6 (L)  8.9 - 10.3 mg/dL Final   Total Protein 87/94/7974 6.4 (L)  6.5 - 8.1 g/dL Final    Albumin 87/94/7974 3.0 (L)  3.5 - 5.0 g/dL Final   AST 87/94/7974 17  15 - 41 U/L Final   ALT 11/30/2024 12  0 - 44 U/L Final   Alkaline Phosphatase 11/30/2024 82  38 - 126 U/L Final   Total Bilirubin 11/30/2024 0.4  0.0 - 1.2 mg/dL Final   GFR, Estimated 11/30/2024 59 (L)  >60 mL/min Final   Comment: (NOTE) Calculated using the CKD-EPI Creatinine Equation (2021)    Anion gap 11/30/2024 10  5 - 15 Final  Performed at [facility], [address], [place], [place] [address]   Hgb A1c MFr Bld 11/30/2024 5.3  4.8 - 5.6 % Final   Comment: (NOTE) Diagnosis of Diabetes The following HbA1c ranges recommended by the American Diabetes Association (ADA) may be used as an aid in the diagnosis of diabetes mellitus.  Hemoglobin             Suggested A1C NGSP%              Diagnosis  <5.7                   Non Diabetic  5.7-6.4                Pre-Diabetic  >6.4                   Diabetic  <7.0                   Glycemic control for                       adults with diabetes.     Mean Plasma Glucose 11/30/2024 105.41  mg/dL Final   Performed at [facility], [address], [place], [place] [address]   Magnesium  11/30/2024 2.2  1.7 - 2.4 mg/dL Final   Performed at [facility], [address], [place], [place] [address]   Alcohol, Ethyl (B) 11/30/2024 <15  <15 mg/dL Final   Comment: (NOTE) For medical purposes only. Performed at [facility], [address] [place], [place] [address]    Cholesterol 11/30/2024 155  0 - 200 mg/dL Final   Triglycerides 87/94/7974 53  <150 mg/dL Final   HDL 87/94/7974 46  >40 mg/dL Final   Total CHOL/HDL Ratio 11/30/2024 3.4  RATIO Final   VLDL 11/30/2024 11  0 - 40 mg/dL Final   LDL Cholesterol 11/30/2024 98  0 - 99 mg/dL Final   Comment:        Total Cholesterol/HDL:CHD Risk Coronary Heart Disease Risk Table                     Men   Women  1/2 Average Risk   3.4   3.3  Average Risk       5.0   4.4  2 X Average Risk    9.6   7.1  3 X Average Risk  23.4   11.0        Use the calculated Patient Ratio above and the CHD Risk Table to determine the patient's CHD Risk.        ATP III CLASSIFICATION (LDL):  <100     mg/dL   Optimal  899-870  mg/dL   Near or Above                    Optimal  130-159  mg/dL   Borderline  839-810  mg/dL   High  >809     mg/dL   Very High Performed at [facility], [address], [place], [place] [address]    TSH 11/30/2024 0.672  0.350 - 4.500 uIU/mL Final   Comment: Performed by a 3rd Generation assay with a functional sensitivity of <=0.01 uIU/mL. Performed at [facility], [address], [place], [place] [address]    Color, Urine 11/29/2024 YELLOW  YELLOW Final   APPearance 11/29/2024 HAZY (  A)  CLEAR Final   Specific Gravity, Urine 11/29/2024 1.031 (H)  1.005 - 1.030 Final   pH 11/29/2024 5.0  5.0 - 8.0 Final   Glucose, UA 11/29/2024 NEGATIVE  NEGATIVE mg/dL Final   Hgb urine dipstick 11/29/2024 SMALL (A)  NEGATIVE Final   Bilirubin Urine 11/29/2024 NEGATIVE  NEGATIVE Final   Ketones, ur 11/29/2024 NEGATIVE  NEGATIVE mg/dL Final   Protein, ur 87/95/7974 30 (A)  NEGATIVE mg/dL Final   Nitrite 87/95/7974 NEGATIVE  NEGATIVE Final   Leukocytes,Ua 11/29/2024 LARGE (A)  NEGATIVE Final   RBC / HPF 11/29/2024 21-50  0 - 5 RBC/hpf Final   WBC, UA 11/29/2024 >50  0 - 5 WBC/hpf Final   Bacteria, UA 11/29/2024 RARE (A)  NONE SEEN Final   Squamous Epithelial / HPF 11/29/2024 0-5  0 - 5 /HPF Final   WBC Clumps 11/29/2024 PRESENT   Final   Mucus 11/29/2024 PRESENT   Final   Performed at [facility], [address] [place], [place] [address]   POC Amphetamine UR 11/29/2024 None Detected  NONE DETECTED (Cut Off Level 1000 ng/mL) Final   POC Secobarbital (BAR) 11/29/2024 None Detected  NONE DETECTED (Cut Off Level 300 ng/mL) Final   POC Buprenorphine (BUP) 11/29/2024 None Detected  NONE DETECTED (Cut Off Level 10 ng/mL) Final   POC Oxazepam (BZO) 11/29/2024  None Detected  NONE DETECTED (Cut Off Level 300 ng/mL) Final   POC Cocaine UR 11/29/2024 Positive (A)  NONE DETECTED (Cut Off Level 300 ng/mL) Final   POC Methamphetamine UR 11/29/2024 None Detected  NONE DETECTED (Cut Off Level 1000 ng/mL) Final   POC Morphine 11/29/2024 None Detected  NONE DETECTED (Cut Off Level 300 ng/mL) Final   POC Methadone UR 11/29/2024 None Detected  NONE DETECTED (Cut Off Level 300 ng/mL) Final   POC Oxycodone UR 11/29/2024 None Detected  NONE DETECTED (Cut Off Level 100 ng/mL) Final   POC Marijuana UR 11/29/2024 Positive (A)  NONE DETECTED (Cut Off Level 50 ng/mL) Final    Blood Alcohol level:  Lab Results  Component Value Date   [facility] <15 11/30/2024    Metabolic Disorder Labs: Lab Results  Component Value Date   HGBA1C 5.3 11/30/2024   MPG 105.41 11/30/2024   No results found for: PROLACTIN Lab Results  Component Value Date   CHOL 155 11/30/2024   TRIG 53 11/30/2024   HDL 46 11/30/2024   CHOLHDL 3.4 11/30/2024   VLDL 11 11/30/2024   LDLCALC 98 11/30/2024    Therapeutic Lab Levels: No results found for: LITHIUM No results found for: VALPROATE No results found for: CBMZ  Physical Findings   PHQ2-9    Flowsheet Row ED from 11/30/2024 in [facility] ED from 11/29/2024 in [facility]  PHQ-2 Total Score 2 0  PHQ-9 Total Score 2 --   Flowsheet Row ED from 11/30/2024 in [facility] ED from 11/29/2024 in [facility] ED from 12/15/2023 in [facility] Emergency Department at [facility]  C-SSRS RISK CATEGORY No Risk No Risk No Risk     Musculoskeletal  Strength & Muscle Tone: within normal limits Gait & Station: normal Patient leans: N/A  Psychiatric Specialty Exam  Presentation  General Appearance:  Casual  Eye Contact: Fair  Speech: Clear and Coherent  Speech Volume: Normal  Handedness: Right   Mood  and Affect  Mood: Depressed  Affect: Congruent   Thought Process  Thought Processes: Coherent  Descriptions of Associations:Intact  Orientation:Full (Time, Place and Person)  Thought Content:Logical; WDL  Diagnosis of Schizophrenia or Schizoaffective disorder in past: No    Hallucinations:Hallucinations: None  Ideas of Reference:None  Suicidal Thoughts:Suicidal Thoughts: No  Homicidal Thoughts:Homicidal Thoughts: No   Sensorium  Memory: Immediate Fair  Judgment: Fair  Insight: Fair   Art Therapist  Concentration: Fair  Attention Span: Fair  Recall: Fair  Fund of Knowledge: Fair  Language: Fair   Psychomotor Activity  Psychomotor Activity: Psychomotor Activity: Normal   Assets  Assets: Resilience   Sleep  Sleep: Sleep: Good  Estimated Sleeping Duration (Last 24 Hours): 10.25-12.75 hours  Nutritional Assessment (For OBS and [facility] admissions only) Has the patient had a weight loss or gain of 10 pounds or more in the last 3 months?: No Has the patient had a decrease in food intake/or appetite?: No Does the patient have dental problems?: No Does the patient have eating habits or behaviors that may be indicators of an eating disorder including binging or inducing vomiting?: No Has the patient recently lost weight without trying?: 0 Has the patient been eating poorly because of a decreased appetite?: 0 Malnutrition Screening Tool Score: 0    Physical Exam  Physical Exam Vitals and nursing note reviewed.  Neurological:     Mental Status: He is alert and oriented to person, place, and time.  Psychiatric:        Mood and Affect: Mood normal.        Behavior: Behavior normal.        Thought Content: Thought content normal.        Judgment: Judgment normal.    Review of Systems  Neurological:  Negative for seizures.  Psychiatric/Behavioral:  Positive for depression and substance abuse. Negative for hallucinations and suicidal  ideas. The patient is not nervous/anxious and does not have insomnia.    Blood pressure 112/70, pulse 60, temperature 98 F (36.7 C), temperature source Oral, resp. rate 18, SpO2 97%. There is no height or weight on file to calculate BMI.  Treatment Plan Summary: Daily contact with patient to assess and evaluate symptoms and progress in treatment, Medication management, and Plan : Accepted to [facility] on Wednesday for rehabilitation. CSW to continue working on discharge disposition. Patient encouraged to participate in therapeutic milieu Continue Norvasc  5 mg daily. Continue Prozac  10 mg daily Continue gabapentin  100 mg p.o. twice daily Continue trazodone  50 mg daily p.o. nightly as needed   CSW to continue working on discharge disposition Patient encouraged to participate in therapeutic milieu  Curtistine Clause, RN - Student NP 12/03/2024 6:34 PM

## 2024-12-03 NOTE — Group Note (Signed)
 Group Topic: Social Support  Group Date: 12/03/2024 Start Time: 2000 End Time: 2030 Facilitators: Joan Plowman B  Department: [facility]  Number of Participants: 7  Group Focus: abuse issues and communication Treatment Modality:  Individual Therapy Interventions utilized were patient education, story telling, and support Purpose: express feelings and increase insight  Name: Lance Walters Date of Birth: May 27, 1956  MR: [id number]    Level of Participation: active Quality of Participation: cooperative Interactions with others: gave feedback Mood/Affect: appropriate Triggers (if applicable): NA Cognition: coherent/clear Progress: Gaining insight Response: NA Plan: patient will be encouraged to keep going to groups  Patients Problems:  Patient Active Problem List   Diagnosis Date Noted   Polysubstance abuse (HCC) 11/30/2024   Nicotine  dependence, cigarettes, uncomplicated 11/22/2023   Mixed hyperlipidemia 11/11/2023   Chest pain 11/10/2023   Hypokalemia 11/10/2023   Abnormal finding on echocardiogram 11/10/2023   Grade I diastolic dysfunction 11/10/2023   Polyp of colon 03/12/2022   Diverticulosis 03/12/2022   Incontinence of feces 12/14/2021   Left lower quadrant pain 12/14/2021   Pyuria 12/14/2021   Bright red stool 09/21/2021   Polyneuropathy, unspecified 02/04/2021   Abnormal renal function test 09/18/2020   Arthritis of right hip 09/18/2020   Neuropathy 09/18/2020   Spinal stenosis in cervical region 09/18/2020   Spinal stenosis of lumbosacral region 09/18/2020   Back pain 08/15/2020   Benign prostatic hyperplasia without lower urinary tract symptoms 08/15/2020   Cocaine use 08/15/2020   Essential hypertension 08/15/2020   Seizure disorder (HCC) 08/15/2020   Substance use disorder 08/15/2020

## 2024-12-03 NOTE — ED Notes (Signed)
 Patient is in the bedroom composed and sleeping.NAD Will continue to monitor for safety

## 2024-12-03 NOTE — Care Management (Addendum)
 [facility] Care Management...  Addendum  Patient completed phone assessment with [facility]  Patient under review  Writer reached out to [employer] [phone].   Writer left HIPAA compliant message for return call

## 2024-12-03 NOTE — Group Note (Signed)
 Group Topic: Recovery Basics  Group Date: 12/03/2024 Start Time: 1215 End Time: 1245 Facilitators: Liliah Dorian, Zane HERO, RN  Department: [facility]  Number of Participants: 1  Group Focus: check in, individual meeting, and nursing group Treatment Modality:  Individual Therapy Interventions utilized were exploration, patient education and social support Purpose: express feelings and increase insight  Name: Lance Walters Date of Birth: 1956-07-17  MR: [id number]    Level of Participation: active Quality of Participation: attentive and cooperative Interactions with others: gave feedback Mood/Affect: appropriate Triggers (if applicable): None identified at this time Cognition: coherent/clear and logical Progress: Gaining insight Response: Patient voices understanding of medications and treatment plan, no concerns voiced regarding the unit at this time. Patient aware of who to speak with in regards to any future concerns should they arise. Plan: patient will be encouraged to continue to attend groups/programming on the unit  Patients Problems:  Patient Active Problem List   Diagnosis Date Noted   Polysubstance abuse (HCC) 11/30/2024   Nicotine  dependence, cigarettes, uncomplicated 11/22/2023   Mixed hyperlipidemia 11/11/2023   Chest pain 11/10/2023   Hypokalemia 11/10/2023   Abnormal finding on echocardiogram 11/10/2023   Grade I diastolic dysfunction 11/10/2023   Polyp of colon 03/12/2022   Diverticulosis 03/12/2022   Incontinence of feces 12/14/2021   Left lower quadrant pain 12/14/2021   Pyuria 12/14/2021   Bright red stool 09/21/2021   Polyneuropathy, unspecified 02/04/2021   Abnormal renal function test 09/18/2020   Arthritis of right hip 09/18/2020   Neuropathy 09/18/2020   Spinal stenosis in cervical region 09/18/2020   Spinal stenosis of lumbosacral region 09/18/2020   Back pain 08/15/2020   Benign prostatic hyperplasia without  lower urinary tract symptoms 08/15/2020   Cocaine use 08/15/2020   Essential hypertension 08/15/2020   Seizure disorder (HCC) 08/15/2020   Substance use disorder 08/15/2020

## 2024-12-03 NOTE — ED Notes (Signed)
 Patient asleep, NAD. Will keep monitoring for safety.

## 2024-12-03 NOTE — ED Notes (Signed)
 Patient alert & oriented x4. Denies intent to harm self or others when asked. Denies A/VH. Patient denies any physical complaints when asked. Scheduled medications administered with no complications. No acute distress noted. Support and encouragement provided. Patient observed in milieu. No inappropriate behaviors observed or reported. Routine safety checks conducted per facility protocol. Encouraged patient to notify staff if any thoughts of harm towards self or others arise. Patient verbalizes understanding and agreement.

## 2024-12-03 NOTE — ED Notes (Signed)
 Pt asleep, CIWA assessment not completed.

## 2024-12-03 NOTE — Group Note (Signed)
 Group Topic: Overcoming Obstacles  Group Date: 12/03/2024 Start Time: 1100 End Time: 1200 Facilitators: Elyssia Strausser, LCSW  Department: [facility]  Number of Participants: 6  Group Focus: acceptance, chemical dependency issues, community group, and coping skills Treatment Modality:  Cognitive Behavioral Therapy Interventions utilized were group exercise, patient education, reminiscence, story telling, and support Purpose: explore maladaptive thinking, express feelings, express irrational fears, improve communication skills, reinforce self-care, relapse prevention strategies, and trigger / craving management  Writer completed a self talk session with the client's to discuss Holidays and Recovery.  The majority of the group reported at some point being incarcerated and how it impacts them to this day.  Group members shared how prison life is similar to everyday life and how substances were readily available in prison.  Writer explored how the holiday's are triggers for people and how they engage with society from day to day.   Name: Lance Walters Date of Birth: Oct 20, 1956  MR: [id number]    Level of Participation: active Quality of Participation: engaged Interactions with others: gave feedback Mood/Affect: blunted, brightens with interaction, and positive Triggers (if applicable):  other users, lonely, depressed. Cognition: goal directed, insightful, and logical Progress: Moderate Response: Client is very engaged and interactive.  Client is optimistic about residential at [facility]  Plan: follow-up as needed  Patients Problems:  Patient Active Problem List   Diagnosis Date Noted   Polysubstance abuse (HCC) 11/30/2024   Nicotine  dependence, cigarettes, uncomplicated 11/22/2023   Mixed hyperlipidemia 11/11/2023   Chest pain 11/10/2023   Hypokalemia 11/10/2023   Abnormal finding on echocardiogram 11/10/2023   Grade I diastolic dysfunction  11/10/2023   Polyp of colon 03/12/2022   Diverticulosis 03/12/2022   Incontinence of feces 12/14/2021   Left lower quadrant pain 12/14/2021   Pyuria 12/14/2021   Bright red stool 09/21/2021   Polyneuropathy, unspecified 02/04/2021   Abnormal renal function test 09/18/2020   Arthritis of right hip 09/18/2020   Neuropathy 09/18/2020   Spinal stenosis in cervical region 09/18/2020   Spinal stenosis of lumbosacral region 09/18/2020   Back pain 08/15/2020   Benign prostatic hyperplasia without lower urinary tract symptoms 08/15/2020   Cocaine use 08/15/2020   Essential hypertension 08/15/2020   Seizure disorder (HCC) 08/15/2020   Substance use disorder 08/15/2020

## 2024-12-03 NOTE — ED Provider Notes (Incomplete)
 Behavioral Health Progress Note  Date and Time: 12/03/2024 12:47 PM Name: Lance Walters MRN:  [id number]  Subjective:  I feel good.  HPI: per admission assessment note:Lance Walters is a 68 year-old male who presents to [facility] voluntarily, unaccompanied, reporting that he needs to detox from substances. Reports he was recently released from prison and was unable to maintain sobriety.  Reports using crack, Marijuana and alcohol on a daily basis. Last use was last night: Patient reports using 1/2 gallon of liquor, 12 beers, crack and Marijuana.    Assessment on 12/03/24:  Pt was laying in bed on approach. Pt reported that he slept ok last night. Pt reports no pan or withdrawal symptoms. Pt denies SI/HI (plan and intent), AVH and paranoia. Pt also did not endorse depressive symptoms or anxiety. Pt states, I'm cool as a cucumber, referring to him being calm and feeling good. Pt reported that he plans to continue to work on his sobriety and stated that he has been accepted to [facility]. Pt also reports that he wants to also work on getting into a [employer] when he completes the resident treatment. Program. Pt provided with support and encouragement.   Diagnosis:  Final diagnoses:  Substance use disorder  Current moderate episode of major depressive disorder, unspecified whether recurrent (HCC)    Total Time spent with patient: 45 minutes  Past Psychiatric History: Documented history related to substance-induced mood disorder, posttraumatic stress disorder, anxiety and depression.  Past Medical History: Hypertension, nicotine  dependency diastolic dysfunction and makes hyperlipidemi, seizure disorder  Family History: None reported Family Psychiatric  History: None reported Social History: Hypertension, nicotine  dependency diastolic dysfunction and makes hyperlipidemi, seizure disorder     Sleep: Good  Appetite:  Good  Current  Medications:  Current Facility-Administered Medications  Medication Dose Route Frequency Provider Last Rate Last Admin   acetaminophen  (TYLENOL ) tablet 650 mg  650 mg Oral Q6H PRN Randall Starlyn HERO, NP       alum & mag hydroxide-simeth (MAALOX/MYLANTA) 200-200-20 MG/5ML suspension 30 mL  30 mL Oral Q4H PRN Randall, Veronique M, NP       amLODipine  (NORVASC ) tablet 5 mg  5 mg Oral Daily Lewis, Tanika N, NP   5 mg at 12/03/24 9096   FLUoxetine  (PROZAC ) capsule 10 mg  10 mg Oral Daily Lewis, Tanika N, NP   10 mg at 12/03/24 9096   gabapentin  (NEURONTIN ) capsule 100 mg  100 mg Oral BID Lewis, Tanika N, NP   100 mg at 12/03/24 9096   magnesium  hydroxide (MILK OF MAGNESIA) suspension 30 mL  30 mL Oral Daily PRN Randall Starlyn HERO, NP       OLANZapine  zydis (ZYPREXA ) disintegrating tablet 5 mg  5 mg Oral TID PRN Randall Starlyn HERO, NP       traZODone  (DESYREL ) tablet 50 mg  50 mg Oral QHS PRN Byungura, Veronique M, NP   50 mg at 12/02/24 2102   Current Outpatient Medications  Medication Sig Dispense Refill   albuterol  (VENTOLIN  HFA) 108 (90 Base) MCG/ACT inhaler Inhale 2 puffs into the lungs every 6 (six) hours as needed for wheezing or shortness of breath.     amantadine (SYMMETREL) 100 MG capsule Take 100 mg by mouth 2 (two) times daily.     amLODipine  (NORVASC ) 5 MG tablet Take 1 tablet (5 mg total) by mouth daily. 30 tablet 2   FLUoxetine  (PROZAC ) 20 MG capsule Take 20 mg by mouth every evening.  gabapentin  (NEURONTIN ) 100 MG capsule Take 100 mg by mouth 3 (three) times daily as needed (For pain).     INCRUSE ELLIPTA 62.5 MCG/ACT AEPB Inhale 1 puff into the lungs daily.     levETIRAcetam  (KEPPRA ) 500 MG tablet Take 500 mg by mouth every 12 (twelve) hours.      Labs  Lab Results:  Admission on 11/29/2024, Discharged on 11/30/2024  Component Date Value Ref Range Status   WBC 11/30/2024 9.0  4.0 - 10.5 K/uL Final   RBC 11/30/2024 4.40  4.22 - 5.81 MIL/uL Final   Hemoglobin  11/30/2024 13.0  13.0 - 17.0 g/dL Final   HCT 87/94/7974 39.9  39.0 - 52.0 % Final   MCV 11/30/2024 90.7  80.0 - 100.0 fL Final   MCH 11/30/2024 29.5  26.0 - 34.0 pg Final   MCHC 11/30/2024 32.6  30.0 - 36.0 g/dL Final   RDW 87/94/7974 13.9  11.5 - 15.5 % Final   Platelets 11/30/2024 238  150 - 400 K/uL Final   nRBC 11/30/2024 0.0  0.0 - 0.2 % Final   Neutrophils Relative % 11/30/2024 55  % Final   Neutro Abs 11/30/2024 4.9  1.7 - 7.7 K/uL Final   Lymphocytes Relative 11/30/2024 33  % Final   Lymphs Abs 11/30/2024 3.0  0.7 - 4.0 K/uL Final   Monocytes Relative 11/30/2024 9  % Final   Monocytes Absolute 11/30/2024 0.8  0.1 - 1.0 K/uL Final   Eosinophils Relative 11/30/2024 2  % Final   Eosinophils Absolute 11/30/2024 0.2  0.0 - 0.5 K/uL Final   Basophils Relative 11/30/2024 1  % Final   Basophils Absolute 11/30/2024 0.1  0.0 - 0.1 K/uL Final   Immature Granulocytes 11/30/2024 0  % Final   Abs Immature Granulocytes 11/30/2024 0.04  0.00 - 0.07 K/uL Final   Performed at [facility], [address] [place], [place] [address]   Sodium 11/30/2024 141  135 - 145 mmol/L Final   Potassium 11/30/2024 4.4  3.5 - 5.1 mmol/L Final   Chloride 11/30/2024 109  98 - 111 mmol/L Final   CO2 11/30/2024 22  22 - 32 mmol/L Final   Glucose, Bld 11/30/2024 107 (H)  70 - 99 mg/dL Final   Glucose reference range applies only to samples taken after fasting for at least 8 hours.   BUN 11/30/2024 21  8 - 23 mg/dL Final   Creatinine, Ser 11/30/2024 1.31 (H)  0.61 - 1.24 mg/dL Final   Calcium 87/94/7974 8.6 (L)  8.9 - 10.3 mg/dL Final   Total Protein 87/94/7974 6.4 (L)  6.5 - 8.1 g/dL Final   Albumin 87/94/7974 3.0 (L)  3.5 - 5.0 g/dL Final   AST 87/94/7974 17  15 - 41 U/L Final   ALT 11/30/2024 12  0 - 44 U/L Final   Alkaline Phosphatase 11/30/2024 82  38 - 126 U/L Final   Total Bilirubin 11/30/2024 0.4  0.0 - 1.2 mg/dL Final   GFR, Estimated 11/30/2024 59 (L)  >60 mL/min Final   Comment:  (NOTE) Calculated using the CKD-EPI Creatinine Equation (2021)    Anion gap 11/30/2024 10  5 - 15 Final   Performed at [facility], [address], [place], [place] [address]   Hgb A1c MFr Bld 11/30/2024 5.3  4.8 - 5.6 % Final   Comment: (NOTE) Diagnosis of Diabetes The following HbA1c ranges recommended by the American Diabetes Association (ADA) may be used as an aid in the  diagnosis of diabetes mellitus.  Hemoglobin             Suggested A1C NGSP%              Diagnosis  <5.7                   Non Diabetic  5.7-6.4                Pre-Diabetic  >6.4                   Diabetic  <7.0                   Glycemic control for                       adults with diabetes.     Mean Plasma Glucose 11/30/2024 105.41  mg/dL Final   Performed at [facility], [address] [name], [place] [address]   Magnesium  11/30/2024 2.2  1.7 - 2.4 mg/dL Final   Performed at [facility], [address] [place], [place] [address]   Alcohol, Ethyl (B) 11/30/2024 <15  <15 mg/dL Final   Comment: (NOTE) For medical purposes only. Performed at [facility], [address], [place], [place] [address]    Cholesterol 11/30/2024 155  0 - 200 mg/dL Final   Triglycerides 87/94/7974 53  <150 mg/dL Final   HDL 87/94/7974 46  >40 mg/dL Final   Total CHOL/HDL Ratio 11/30/2024 3.4  RATIO Final   VLDL 11/30/2024 11  0 - 40 mg/dL Final   LDL Cholesterol 11/30/2024 98  0 - 99 mg/dL Final   Comment:        Total Cholesterol/HDL:CHD Risk Coronary Heart Disease Risk Table                     Men   Women  1/2 Average Risk   3.4   3.3  Average Risk       5.0   4.4  2 X Average Risk   9.6   7.1  3 X Average Risk  23.4   11.0        Use the calculated Patient Ratio above and the CHD Risk Table to determine the patient's CHD Risk.        ATP III CLASSIFICATION (LDL):  <100     mg/dL   Optimal  899-870  mg/dL   Near or Above                    Optimal  130-159  mg/dL   Borderline   839-810  mg/dL   High  >809     mg/dL   Very High Performed at [facility], [address] [place], [place] [address]    TSH 11/30/2024 0.672  0.350 - 4.500 uIU/mL Final   Comment: Performed by a 3rd Generation assay with a functional sensitivity of <=0.01 uIU/mL. Performed at [facility], [address] [place], [place] [address]    Color, Urine 11/29/2024 YELLOW  YELLOW Final   APPearance 11/29/2024 HAZY (A)  CLEAR Final   Specific Gravity, Urine 11/29/2024 1.031 (H)  1.005 - 1.030 Final   pH 11/29/2024 5.0  5.0 - 8.0 Final   Glucose, UA 11/29/2024 NEGATIVE  NEGATIVE mg/dL Final   Hgb urine dipstick 11/29/2024 SMALL (A)  NEGATIVE Final   Bilirubin Urine 11/29/2024  NEGATIVE  NEGATIVE Final   Ketones, ur 11/29/2024 NEGATIVE  NEGATIVE mg/dL Final   Protein, ur 87/95/7974 30 (A)  NEGATIVE mg/dL Final   Nitrite 87/95/7974 NEGATIVE  NEGATIVE Final   Leukocytes,Ua 11/29/2024 LARGE (A)  NEGATIVE Final   RBC / HPF 11/29/2024 21-50  0 - 5 RBC/hpf Final   WBC, UA 11/29/2024 >50  0 - 5 WBC/hpf Final   Bacteria, UA 11/29/2024 RARE (A)  NONE SEEN Final   Squamous Epithelial / HPF 11/29/2024 0-5  0 - 5 /HPF Final   WBC Clumps 11/29/2024 PRESENT   Final   Mucus 11/29/2024 PRESENT   Final   Performed at [facility], [address], [place], [place] [address]   POC Amphetamine UR 11/29/2024 None Detected  NONE DETECTED (Cut Off Level 1000 ng/mL) Final   POC Secobarbital (BAR) 11/29/2024 None Detected  NONE DETECTED (Cut Off Level 300 ng/mL) Final   POC Buprenorphine (BUP) 11/29/2024 None Detected  NONE DETECTED (Cut Off Level 10 ng/mL) Final   POC Oxazepam (BZO) 11/29/2024 None Detected  NONE DETECTED (Cut Off Level 300 ng/mL) Final   POC Cocaine UR 11/29/2024 Positive (A)  NONE DETECTED (Cut Off Level 300 ng/mL) Final   POC Methamphetamine UR 11/29/2024 None Detected  NONE DETECTED (Cut Off Level 1000 ng/mL) Final   POC Morphine 11/29/2024 None Detected  NONE DETECTED (Cut Off  Level 300 ng/mL) Final   POC Methadone UR 11/29/2024 None Detected  NONE DETECTED (Cut Off Level 300 ng/mL) Final   POC Oxycodone UR 11/29/2024 None Detected  NONE DETECTED (Cut Off Level 100 ng/mL) Final   POC Marijuana UR 11/29/2024 Positive (A)  NONE DETECTED (Cut Off Level 50 ng/mL) Final    Blood Alcohol level:  Lab Results  Component Value Date   [facility] <15 11/30/2024    Metabolic Disorder Labs: Lab Results  Component Value Date   HGBA1C 5.3 11/30/2024   MPG 105.41 11/30/2024   No results found for: PROLACTIN Lab Results  Component Value Date   CHOL 155 11/30/2024   TRIG 53 11/30/2024   HDL 46 11/30/2024   CHOLHDL 3.4 11/30/2024   VLDL 11 11/30/2024   LDLCALC 98 11/30/2024    Therapeutic Lab Levels: No results found for: LITHIUM No results found for: VALPROATE No results found for: CBMZ  Physical Findings   PHQ2-9    Flowsheet Row ED from 11/30/2024 in [facility] ED from 11/29/2024 in [facility]  PHQ-2 Total Score 0 0   Flowsheet Row ED from 11/30/2024 in [facility] ED from 11/29/2024 in [facility] ED from 01/30/2024 in [facility] Emergency Department at [facility]  C-SSRS RISK CATEGORY No Risk No Risk No Risk     Musculoskeletal  Strength & Muscle Tone: within normal limits Gait & Station: normal Patient leans: N/A  Psychiatric Specialty Exam  Presentation  General Appearance:  Appropriate for Environment  Eye Contact: Fair  Speech: Clear and Coherent  Speech Volume: Normal  Handedness: Right   Mood and Affect  Mood: Euthymic  Affect: Congruent   Thought Process  Thought Processes: Coherent  Descriptions of Associations:Intact  Orientation:Full (Time, Place and Person)  Thought Content:Logical  Diagnosis of Schizophrenia or Schizoaffective disorder in past: No    Hallucinations:Hallucinations:  None  Ideas of Reference:None  Suicidal Thoughts:Suicidal Thoughts: No  Homicidal Thoughts:Homicidal Thoughts: No   Sensorium  Memory: Immediate Good; Recent Good  Judgment: Fair  Insight: Good   Executive Functions  Concentration: Good  Attention Span: Good  Recall: Good  Fund of Knowledge: Good  Language: Fair   Psychomotor Activity  Psychomotor Activity: Psychomotor Activity: Normal   Assets  Assets: Desire for Improvement; Communication Skills   Sleep  Sleep: Sleep: Fair  Estimated Sleeping Duration (Last 24 Hours): 12.00-14.75 hours  No data recorded  Physical Exam  Physical Exam Vitals and nursing note reviewed.  Neurological:     Mental Status: He is alert and oriented to person, place, and time.  Psychiatric:        Mood and Affect: Mood normal.        Behavior: Behavior normal.        Thought Content: Thought content normal.        Judgment: Judgment normal.    Review of Systems  Neurological:  Negative for seizures.  Psychiatric/Behavioral:  Positive for depression and substance abuse. Negative for hallucinations and suicidal ideas. The patient is not nervous/anxious and does not have insomnia.    Blood pressure 110/83, pulse 76, temperature 98 F (36.7 C), temperature source Oral, resp. rate 18, SpO2 98%. There is no height or weight on file to calculate BMI.  Treatment Plan Summary: Daily contact with patient to assess and evaluate symptoms and progress in treatment, Medication management, and Plan : Accepted to [facility] on Wednesday for rehabilitation. CSW to continue working on discharge disposition. Patient encouraged to participate in therapeutic milieu Continue Norvasc  5 mg daily. Continue Prozac  10 mg daily Continue gabapentin  100 mg p.o. twice daily Continue trazodone  50 mg daily p.o. nightly as needed   CSW to continue working on discharge disposition Patient encouraged to participate in therapeutic  milieu  Curtistine Clause, RN - Student NP 12/03/2024 12:47 PM

## 2024-12-03 NOTE — Group Note (Unsigned)
 Group Topic: Relapse and Recovery  Group Date: 12/03/2024 Start Time: 1030 End Time: 1100 Facilitators: Lonzell Dwayne RAMAN, NT  Department: [facility]  Number of Participants: 4  Group Focus: chemical dependency education and chemical dependency issues Treatment Modality:  Behavior Modification Therapy and Solution-Focused Therapy Interventions utilized were mental fitness, patient education, and problem solving Purpose: enhance coping skills, explore maladaptive thinking, express irrational fears, increase insight, regain self-worth, and reinforce self-care   Name: Lance Walters Date of Birth: 1956-10-31  MR: [id number]    Level of Participation: {THERAPIES; PSYCH GROUP PARTICIPATION OZCZO:76008} Quality of Participation: {THERAPIES; PSYCH QUALITY OF PARTICIPATION:23992} Interactions with others: {THERAPIES; PSYCH INTERACTIONS:23993} Mood/Affect: {THERAPIES; PSYCH MOOD/AFFECT:23994} Triggers (if applicable): *** Cognition: {THERAPIES; PSYCH COGNITION:23995} Progress: {THERAPIES; PSYCH PROGRESS:23997} Response: *** Plan: {THERAPIES; PSYCH EOJW:76003}  Patients Problems:  Patient Active Problem List   Diagnosis Date Noted   Polysubstance abuse (HCC) 11/30/2024   Nicotine  dependence, cigarettes, uncomplicated 11/22/2023   Mixed hyperlipidemia 11/11/2023   Chest pain 11/10/2023   Hypokalemia 11/10/2023   Abnormal finding on echocardiogram 11/10/2023   Grade I diastolic dysfunction 11/10/2023   Polyp of colon 03/12/2022   Diverticulosis 03/12/2022   Incontinence of feces 12/14/2021   Left lower quadrant pain 12/14/2021   Pyuria 12/14/2021   Bright red stool 09/21/2021   Polyneuropathy, unspecified 02/04/2021   Abnormal renal function test 09/18/2020   Arthritis of right hip 09/18/2020   Neuropathy 09/18/2020   Spinal stenosis in cervical region 09/18/2020   Spinal stenosis of lumbosacral region 09/18/2020   Back pain 08/15/2020    Benign prostatic hyperplasia without lower urinary tract symptoms 08/15/2020   Cocaine use 08/15/2020   Essential hypertension 08/15/2020   Seizure disorder (HCC) 08/15/2020   Substance use disorder 08/15/2020

## 2024-12-04 DIAGNOSIS — F1910 Other psychoactive substance abuse, uncomplicated: Secondary | ICD-10-CM | POA: Diagnosis not present

## 2024-12-04 LAB — COMPREHENSIVE METABOLIC PANEL WITH GFR
ALT: 16 U/L (ref 0–44)
AST: 19 U/L (ref 15–41)
Albumin: 3.3 g/dL — ABNORMAL LOW (ref 3.5–5.0)
Alkaline Phosphatase: 80 U/L (ref 38–126)
Anion gap: 5 (ref 5–15)
BUN: 13 mg/dL (ref 8–23)
CO2: 27 mmol/L (ref 22–32)
Calcium: 9 mg/dL (ref 8.9–10.3)
Chloride: 107 mmol/L (ref 98–111)
Creatinine, Ser: 1.12 mg/dL (ref 0.61–1.24)
GFR, Estimated: >60 mL/min (ref >60)
Glucose, Bld: 79 mg/dL (ref 70–99)
Potassium: 4.4 mmol/L (ref 3.5–5.1)
Sodium: 139 mmol/L (ref 135–145)
Total Bilirubin: 0.6 mg/dL (ref 0.0–1.2)
Total Protein: 7.1 g/dL (ref 6.5–8.1)

## 2024-12-04 LAB — URINALYSIS, ROUTINE W REFLEX MICROSCOPIC
Bilirubin Urine: NEGATIVE
Glucose, UA: NEGATIVE mg/dL
Hgb urine dipstick: NEGATIVE
Ketones, ur: NEGATIVE mg/dL
Leukocytes,Ua: NEGATIVE
Nitrite: NEGATIVE
Protein, ur: NEGATIVE mg/dL
Specific Gravity, Urine: 1.015 (ref 1.005–1.030)
pH: 8 (ref 5.0–8.0)

## 2024-12-04 MED ORDER — LEVETIRACETAM 500 MG PO TABS: 500.0000 mg | ORAL_TABLET | Freq: Two times a day (BID) | ORAL | 0 refills | Status: AC

## 2024-12-04 MED ORDER — ALBUTEROL SULFATE HFA 108 (90 BASE) MCG/ACT IN AERS: 1.0000 | INHALATION_SPRAY | RESPIRATORY_TRACT | Status: DC | PRN

## 2024-12-04 MED ORDER — POLYETHYLENE GLYCOL 3350 17 G PO PACK: 17.0000 g | PACK | Freq: Every day | ORAL | Status: DC | PRN

## 2024-12-04 MED ORDER — DOCUSATE SODIUM 100 MG PO CAPS: 100.0000 mg | ORAL_CAPSULE | Freq: Every day | ORAL | 0 refills | Status: AC

## 2024-12-04 MED ORDER — DOCUSATE SODIUM 100 MG PO CAPS
100.0000 mg | ORAL_CAPSULE | Freq: Every day | ORAL | Status: DC
  Administered 2024-12-04: 100 mg via ORAL
  Filled 2024-12-04: qty 1

## 2024-12-04 MED ORDER — GABAPENTIN 100 MG PO CAPS: 100.0000 mg | ORAL_CAPSULE | Freq: Two times a day (BID) | ORAL | 0 refills | Status: AC

## 2024-12-04 MED ORDER — LEVETIRACETAM 500 MG PO TABS
500.0000 mg | ORAL_TABLET | Freq: Two times a day (BID) | ORAL | Status: DC
  Administered 2024-12-04 (×2): 500 mg via ORAL
  Filled 2024-12-04 (×2): qty 30
  Filled 2024-12-04 (×2): qty 1

## 2024-12-04 MED ORDER — FLUOXETINE HCL 10 MG PO CAPS: 10.0000 mg | ORAL_CAPSULE | Freq: Every day | ORAL | 0 refills | Status: AC

## 2024-12-04 MED ORDER — TRAZODONE HCL 50 MG PO TABS: 50.0000 mg | ORAL_TABLET | Freq: Every evening | ORAL | 0 refills | Status: AC | PRN

## 2024-12-04 MED ORDER — POLYETHYLENE GLYCOL 3350 17 G PO PACK: 17.0000 g | PACK | Freq: Every day | ORAL | 0 refills | Status: AC | PRN

## 2024-12-04 MED ORDER — AMLODIPINE BESYLATE 5 MG PO TABS: 5.0000 mg | ORAL_TABLET | Freq: Every day | ORAL | 0 refills | Status: DC

## 2024-12-04 MED ORDER — ALBUTEROL SULFATE HFA 108 (90 BASE) MCG/ACT IN AERS: 2.0000 | INHALATION_SPRAY | RESPIRATORY_TRACT | 0 refills | Status: AC | PRN

## 2024-12-04 NOTE — Group Note (Signed)
 Group Topic: Positive Affirmations  Group Date: 12/04/2024 Start Time: 1959 End Time: 2100 Facilitators: Joan Plowman B  Department: [facility]  Number of Participants: 5  Group Focus: check in, coping skills, relapse prevention, and substance abuse education Treatment Modality:  Individual Therapy Interventions utilized were leisure development, patient education, and support Purpose: express feelings, increase insight, and relapse prevention strategies  Name: Lance Walters Date of Birth: 1956-02-10  MR: [id number]    Level of Participation: PT DID NOT ATTEND GROUPS   Patients Problems:  Patient Active Problem List   Diagnosis Date Noted   Polysubstance abuse (HCC) 11/30/2024   Nicotine  dependence, cigarettes, uncomplicated 11/22/2023   Mixed hyperlipidemia 11/11/2023   Chest pain 11/10/2023   Hypokalemia 11/10/2023   Abnormal finding on echocardiogram 11/10/2023   Grade I diastolic dysfunction 11/10/2023   Polyp of colon 03/12/2022   Diverticulosis 03/12/2022   Incontinence of feces 12/14/2021   Left lower quadrant pain 12/14/2021   Pyuria 12/14/2021   Bright red stool 09/21/2021   Polyneuropathy, unspecified 02/04/2021   Abnormal renal function test 09/18/2020   Arthritis of right hip 09/18/2020   Neuropathy 09/18/2020   Spinal stenosis in cervical region 09/18/2020   Spinal stenosis of lumbosacral region 09/18/2020   Back pain 08/15/2020   Benign prostatic hyperplasia without lower urinary tract symptoms 08/15/2020   Cocaine use 08/15/2020   Essential hypertension 08/15/2020   Seizure disorder (HCC) 08/15/2020   Substance use disorder 08/15/2020

## 2024-12-04 NOTE — ED Provider Notes (Addendum)
 Behavioral Health Progress Note  Date and Time: 12/04/2024 2:46 PM Name: Lance Walters MRN:  [id number]  HPI: per admission assessment note:Lance Walters is a 68 year-old male who presents to [facility] voluntarily, unaccompanied, reporting that he needs to detox from substances. Reports he was recently released from prison and was unable to maintain sobriety.  Reports using crack, Marijuana and alcohol on a daily basis. Last use was last night: Patient reports using 1/2 gallon of liquor, 12 beers, crack and Marijuana.  Dondra, Starlyn HERO, NP Date of Service: 11/29/2024 11:22 PM)   Assessment on 12/04/24: On assessment today, the pt reports that their mood is euthymic, improved since admission, and stable. Denies feeling down, depressed, or sad.  Reports that anxiety symptoms are at manageable level.  Sleep is stable, which patient verbalizes is an improvement since admission Appetite is stable. Patient reports that he is eating all of his meals for breakfast, lunch and dinner. Concentration is without complaint.  Energy level is adequate & improved since admission. Patient denies having any suicidal thoughts. Denies having any suicidal intent and plan.  Denies having any HI.  Denies having psychotic symptoms. Specifically denies AVH, denies paranoia and denies first ranks symptoms.   Denies having side effects to current psychiatric medications.   Medication adjustments for today: -Adding daily colace for complaints of constipation. -Miralax  PRN for constipation -Keppra   500 mg BID (home med) for seizure activity. Educated on medication rationales, side effects, benefits.   Labs Reviewed:  -Repeat Urinalysis with culture ordered due to large leukocytes in previous sample.  -Repeat CMP due to elevated CR in previous sample-Patient educated on the above labs and verbalizes understanding.  Discussed discharge planning for tomorrow, 12/05/2024.  Patient states that he would  like to go home tomorrow to the [facility], in order to make sure that he secures his housing prior to going to [facility] where he has been accepted for rehabilitative services the fallowing day on 12/06/2024. Patient is verbalizing motivation to go to this treatment, and has been educated on the importance of a door to door transfer to mitigate the risk of relapsing, but is adamant about being discharged tomorrow. He has stated that he will reach out to his parole officer to let them know of his plan. We will plan to discharge patient tomorrow morning.  Diagnosis:  Final diagnoses:  Alcohol use disorder  Cocaine use disorder (HCC)  MDD (major depressive disorder), recurrent severe, without psychosis (HCC)  Drug-induced insomnia (HCC)  Anxiety state   Total Time spent with patient: 45 minutes  Past Psychiatric History: Documented history related to substance-induced mood disorder, posttraumatic stress disorder, anxiety and depression.  Past Medical History: Hypertension, nicotine  dependency diastolic dysfunction and makes hyperlipidemi, seizure disorder  Family History: None reported Family Psychiatric  History: None reported Social History: Hypertension, nicotine  dependency diastolic dysfunction and makes hyperlipidemi, seizure disorder     Sleep: Good  Appetite:  Good  Current Medications:  Current Facility-Administered Medications  Medication Dose Route Frequency Provider Last Rate Last Admin   acetaminophen  (TYLENOL ) tablet 650 mg  650 mg Oral Q6H PRN Randall, Veronique M, NP       albuterol  (VENTOLIN  HFA) 108 (90 Base) MCG/ACT inhaler 1-2 puff  1-2 puff Inhalation Q4H PRN Danali Marinos, NP       alum & mag hydroxide-simeth (MAALOX/MYLANTA) 200-200-20 MG/5ML suspension 30 mL  30 mL Oral Q4H PRN Randall Starlyn HERO, NP       amLODipine  (NORVASC )  tablet 5 mg  5 mg Oral Daily Lewis, Tanika N, NP   5 mg at 12/04/24 9091   FLUoxetine  (PROZAC ) capsule 10  mg  10 mg Oral Daily Lewis, Tanika N, NP   10 mg at 12/04/24 9091   gabapentin  (NEURONTIN ) capsule 100 mg  100 mg Oral BID Ezzard Staci SAILOR, NP   100 mg at 12/04/24 9091   levETIRAcetam  (KEPPRA ) tablet 500 mg  500 mg Oral BID Gottfried, Rhoda J, MD   500 mg at 12/04/24 1208   magnesium  hydroxide (MILK OF MAGNESIA) suspension 30 mL  30 mL Oral Daily PRN Randall Starlyn HERO, NP   30 mL at 12/04/24 0908   OLANZapine  zydis (ZYPREXA ) disintegrating tablet 5 mg  5 mg Oral TID PRN Byungura, Veronique M, NP       traZODone  (DESYREL ) tablet 50 mg  50 mg Oral QHS PRN Byungura, Veronique M, NP   50 mg at 12/03/24 2142   Current Outpatient Medications  Medication Sig Dispense Refill   albuterol  (VENTOLIN  HFA) 108 (90 Base) MCG/ACT inhaler Inhale 2 puffs into the lungs every 6 (six) hours as needed for wheezing or shortness of breath.     amantadine (SYMMETREL) 100 MG capsule Take 100 mg by mouth 2 (two) times daily.     amLODipine  (NORVASC ) 5 MG tablet Take 1 tablet (5 mg total) by mouth daily. 30 tablet 2   FLUoxetine  (PROZAC ) 20 MG capsule Take 20 mg by mouth every evening.     gabapentin  (NEURONTIN ) 100 MG capsule Take 100 mg by mouth 3 (three) times daily as needed (For pain).     INCRUSE ELLIPTA 62.5 MCG/ACT AEPB Inhale 1 puff into the lungs daily.     levETIRAcetam  (KEPPRA ) 500 MG tablet Take 500 mg by mouth every 12 (twelve) hours.      Labs  Lab Results:  Admission on 11/29/2024, Discharged on 11/30/2024  Component Date Value Ref Range Status   WBC 11/30/2024 9.0  4.0 - 10.5 K/uL Final   RBC 11/30/2024 4.40  4.22 - 5.81 MIL/uL Final   Hemoglobin 11/30/2024 13.0  13.0 - 17.0 g/dL Final   HCT 87/94/7974 39.9  39.0 - 52.0 % Final   MCV 11/30/2024 90.7  80.0 - 100.0 fL Final   MCH 11/30/2024 29.5  26.0 - 34.0 pg Final   MCHC 11/30/2024 32.6  30.0 - 36.0 g/dL Final   RDW 87/94/7974 13.9  11.5 - 15.5 % Final   Platelets 11/30/2024 238  150 - 400 K/uL Final   nRBC 11/30/2024 0.0  0.0 - 0.2 %  Final   Neutrophils Relative % 11/30/2024 55  % Final   Neutro Abs 11/30/2024 4.9  1.7 - 7.7 K/uL Final   Lymphocytes Relative 11/30/2024 33  % Final   Lymphs Abs 11/30/2024 3.0  0.7 - 4.0 K/uL Final   Monocytes Relative 11/30/2024 9  % Final   Monocytes Absolute 11/30/2024 0.8  0.1 - 1.0 K/uL Final   Eosinophils Relative 11/30/2024 2  % Final   Eosinophils Absolute 11/30/2024 0.2  0.0 - 0.5 K/uL Final   Basophils Relative 11/30/2024 1  % Final   Basophils Absolute 11/30/2024 0.1  0.0 - 0.1 K/uL Final   Immature Granulocytes 11/30/2024 0  % Final   Abs Immature Granulocytes 11/30/2024 0.04  0.00 - 0.07 K/uL Final   Performed at [facility], [address] [place], [place] [address]   Sodium 11/30/2024 141  135 - 145 mmol/L Final  Potassium 11/30/2024 4.4  3.5 - 5.1 mmol/L Final   Chloride 11/30/2024 109  98 - 111 mmol/L Final   CO2 11/30/2024 22  22 - 32 mmol/L Final   Glucose, Bld 11/30/2024 107 (H)  70 - 99 mg/dL Final   Glucose reference range applies only to samples taken after fasting for at least 8 hours.   BUN 11/30/2024 21  8 - 23 mg/dL Final   Creatinine, Ser 11/30/2024 1.31 (H)  0.61 - 1.24 mg/dL Final   Calcium 87/94/7974 8.6 (L)  8.9 - 10.3 mg/dL Final   Total Protein 87/94/7974 6.4 (L)  6.5 - 8.1 g/dL Final   Albumin 87/94/7974 3.0 (L)  3.5 - 5.0 g/dL Final   AST 87/94/7974 17  15 - 41 U/L Final   ALT 11/30/2024 12  0 - 44 U/L Final   Alkaline Phosphatase 11/30/2024 82  38 - 126 U/L Final   Total Bilirubin 11/30/2024 0.4  0.0 - 1.2 mg/dL Final   GFR, Estimated 11/30/2024 59 (L)  >60 mL/min Final   Comment: (NOTE) Calculated using the CKD-EPI Creatinine Equation (2021)    Anion gap 11/30/2024 10  5 - 15 Final   Performed at [facility], [address] [place], [place] [address]   Hgb A1c MFr Bld 11/30/2024 5.3  4.8 - 5.6 % Final   Comment: (NOTE) Diagnosis of Diabetes The following HbA1c ranges recommended by the American Diabetes Association (ADA)  may be used as an aid in the diagnosis of diabetes mellitus.  Hemoglobin             Suggested A1C NGSP%              Diagnosis  <5.7                   Non Diabetic  5.7-6.4                Pre-Diabetic  >6.4                   Diabetic  <7.0                   Glycemic control for                       adults with diabetes.     Mean Plasma Glucose 11/30/2024 105.41  mg/dL Final   Performed at [facility], [address], [place], [place] [address]   Magnesium  11/30/2024 2.2  1.7 - 2.4 mg/dL Final   Performed at [facility], [address], [place], [place] [address]   Alcohol, Ethyl (B) 11/30/2024 <15  <15 mg/dL Final   Comment: (NOTE) For medical purposes only. Performed at [facility], [address] [place], [place] [address]    Cholesterol 11/30/2024 155  0 - 200 mg/dL Final   Triglycerides 87/94/7974 53  <150 mg/dL Final   HDL 87/94/7974 46  >40 mg/dL Final   Total CHOL/HDL Ratio 11/30/2024 3.4  RATIO Final   VLDL 11/30/2024 11  0 - 40 mg/dL Final   LDL Cholesterol 11/30/2024 98  0 - 99 mg/dL Final   Comment:        Total Cholesterol/HDL:CHD Risk Coronary Heart Disease Risk Table                     Men   Women  1/2 Average Risk   3.4  3.3  Average Risk       5.0   4.4  2 X Average Risk   9.6   7.1  3 X Average Risk  23.4   11.0        Use the calculated Patient Ratio above and the CHD Risk Table to determine the patient's CHD Risk.        ATP III CLASSIFICATION (LDL):  <100     mg/dL   Optimal  899-870  mg/dL   Near or Above                    Optimal  130-159  mg/dL   Borderline  839-810  mg/dL   High  >809     mg/dL   Very High Performed at [facility], [address], [place], [place] [address]    TSH 11/30/2024 0.672  0.350 - 4.500 uIU/mL Final   Comment: Performed by a 3rd Generation assay with a functional sensitivity of <=0.01 uIU/mL. Performed at [facility], [address], [place], [place] [address]    Color,  Urine 11/29/2024 YELLOW  YELLOW Final   APPearance 11/29/2024 HAZY (A)  CLEAR Final   Specific Gravity, Urine 11/29/2024 1.031 (H)  1.005 - 1.030 Final   pH 11/29/2024 5.0  5.0 - 8.0 Final   Glucose, UA 11/29/2024 NEGATIVE  NEGATIVE mg/dL Final   Hgb urine dipstick 11/29/2024 SMALL (A)  NEGATIVE Final   Bilirubin Urine 11/29/2024 NEGATIVE  NEGATIVE Final   Ketones, ur 11/29/2024 NEGATIVE  NEGATIVE mg/dL Final   Protein, ur 87/95/7974 30 (A)  NEGATIVE mg/dL Final   Nitrite 87/95/7974 NEGATIVE  NEGATIVE Final   Leukocytes,Ua 11/29/2024 LARGE (A)  NEGATIVE Final   RBC / HPF 11/29/2024 21-50  0 - 5 RBC/hpf Final   WBC, UA 11/29/2024 >50  0 - 5 WBC/hpf Final   Bacteria, UA 11/29/2024 RARE (A)  NONE SEEN Final   Squamous Epithelial / HPF 11/29/2024 0-5  0 - 5 /HPF Final   WBC Clumps 11/29/2024 PRESENT   Final   Mucus 11/29/2024 PRESENT   Final   Performed at [facility], [address] [place], [place] [address]   POC Amphetamine UR 11/29/2024 None Detected  NONE DETECTED (Cut Off Level 1000 ng/mL) Final   POC Secobarbital (BAR) 11/29/2024 None Detected  NONE DETECTED (Cut Off Level 300 ng/mL) Final   POC Buprenorphine (BUP) 11/29/2024 None Detected  NONE DETECTED (Cut Off Level 10 ng/mL) Final   POC Oxazepam (BZO) 11/29/2024 None Detected  NONE DETECTED (Cut Off Level 300 ng/mL) Final   POC Cocaine UR 11/29/2024 Positive (A)  NONE DETECTED (Cut Off Level 300 ng/mL) Final   POC Methamphetamine UR 11/29/2024 None Detected  NONE DETECTED (Cut Off Level 1000 ng/mL) Final   POC Morphine 11/29/2024 None Detected  NONE DETECTED (Cut Off Level 300 ng/mL) Final   POC Methadone UR 11/29/2024 None Detected  NONE DETECTED (Cut Off Level 300 ng/mL) Final   POC Oxycodone UR 11/29/2024 None Detected  NONE DETECTED (Cut Off Level 100 ng/mL) Final   POC Marijuana UR 11/29/2024 Positive (A)  NONE DETECTED (Cut Off Level 50 ng/mL) Final    Blood Alcohol level:  Lab Results  Component Value Date    [facility] <15 11/30/2024    Metabolic Disorder Labs: Lab Results  Component Value Date   HGBA1C 5.3 11/30/2024   MPG 105.41 11/30/2024   No results found for: PROLACTIN Lab Results  Component Value Date   CHOL 155 11/30/2024   TRIG 53 11/30/2024   HDL 46 11/30/2024   CHOLHDL 3.4 11/30/2024   VLDL 11 11/30/2024   LDLCALC 98 11/30/2024    Therapeutic Lab Levels: No results found for: LITHIUM No results found for: VALPROATE No results found for: CBMZ  Physical Findings   PHQ2-9    Flowsheet Row ED from 11/30/2024 in [facility] ED from 11/29/2024 in [facility]  PHQ-2 Total Score 2 0  PHQ-9 Total Score 2 --   Flowsheet Row ED from 11/30/2024 in [facility] ED from 11/29/2024 in [facility] ED from 12/15/2023 in [facility] Emergency Department at [facility]  C-SSRS RISK CATEGORY No Risk No Risk No Risk     Musculoskeletal  Strength & Muscle Tone: within normal limits Gait & Station: normal Patient leans: N/A  Psychiatric Specialty Exam  Presentation  General Appearance:  Casual  Eye Contact: Fair  Speech: Clear and Coherent  Speech Volume: Normal  Handedness: Right   Mood and Affect  Mood: Euthymic  Affect: Congruent   Thought Process  Thought Processes: Coherent  Descriptions of Associations:Intact  Orientation:Full (Time, Place and Person)  Thought Content:Logical  Diagnosis of Schizophrenia or Schizoaffective disorder in past: No    Hallucinations:Hallucinations: None  Ideas of Reference:None  Suicidal Thoughts:Suicidal Thoughts: No  Homicidal Thoughts:Homicidal Thoughts: No    Sensorium  Memory: Immediate Fair  Judgment: Fair  Insight: Fair   Art Therapist  Concentration: Fair  Attention Span: Fair  Recall: Fair  Fund of Knowledge: Fair  Language: Fair   Psychomotor  Activity  Psychomotor Activity: Psychomotor Activity: Normal   Assets  Assets: Resilience   Sleep  Sleep: Sleep: Good  Estimated Sleeping Duration (Last 24 Hours): 8.00-9.75 hours  Nutritional Assessment (For OBS and [facility] admissions only) Has the patient had a weight loss or gain of 10 pounds or more in the last 3 months?: No Has the patient had a decrease in food intake/or appetite?: No Does the patient have dental problems?: No Does the patient have eating habits or behaviors that may be indicators of an eating disorder including binging or inducing vomiting?: No Has the patient recently lost weight without trying?: 0 Has the patient been eating poorly because of a decreased appetite?: 0 Malnutrition Screening Tool Score: 0    Physical Exam  Physical Exam Vitals and nursing note reviewed.  Constitutional:      Appearance: Normal appearance.  Musculoskeletal:        General: Normal range of motion.     Cervical back: Normal range of motion.  Neurological:     Mental Status: He is alert and oriented to person, place, and time.  Psychiatric:        Mood and Affect: Mood normal.        Behavior: Behavior normal.        Thought Content: Thought content normal.        Judgment: Judgment normal.    Review of Systems  Neurological:  Negative for seizures.  Psychiatric/Behavioral:  Positive for depression (stable) and substance abuse (motivated to stop using and verbalizing willingness for rehab). Negative for hallucinations and suicidal ideas. The patient is not nervous/anxious and does not have insomnia.   All other systems reviewed and are negative.  Blood pressure 124/84, pulse 84, temperature 98 F (36.7 C), temperature source Oral, resp. rate 18, SpO2 95%. There is no  height or weight on file to calculate BMI.  Treatment Plan Summary: Daily contact with patient to assess and evaluate symptoms and progress in treatment, Medication management, and Plan : Accepted to  [facility] on Wednesday for rehabilitation. CSW to continue working on discharge disposition. Patient encouraged to participate in therapeutic milieu  Safety and Monitoring: Voluntary admission to inpatient psychiatric unit for safety, stabilization and treatment Daily contact with patient to assess and evaluate symptoms and progress in treatment Patient's case to be discussed in multi-disciplinary team meeting Observation Level : q15 minute checks Vital signs: q12 hours Precautions: Safety  Long Term Goal(s): Improvement in symptoms so as ready for discharge  Short Term Goals: Ability to identify changes in lifestyle to reduce recurrence of condition will improve, Ability to verbalize feelings will improve, Ability to disclose and discuss suicidal ideas, Ability to demonstrate self-control will improve, Ability to identify and develop effective coping behaviors will improve, Ability to maintain clinical measurements within normal limits will improve, Compliance with prescribed medications will improve, and Ability to identify triggers associated with substance abuse/mental health issues will improve  Diagnoses Principal Problem:   Polysubstance abuse (HCC) Active Problems:   Cocaine use   Substance use disorder   Nicotine  dependence, cigarettes, uncomplicated  Medications: -Restart Keppra  500 mg BID for seizure activities (home med) -Start Colace 100 mg daily for constipation prophylaxis Continue Norvasc  5 mg daily. Continue Prozac  10 mg daily Continue gabapentin  100 mg p.o. twice daily  PRNS -Start Miralax  daily PRN for constipation -Continue trazodone  50 mg daily p.o. nightly as needed -Continue Tylenol  650 mg every 6 hours PRN for mild pain -Continue Maalox 30 mg every 4 hrs PRN for indigestion -Continue Milk of Magnesia as needed every 6 hrs for constipation -Zyprexa  5 mg TID PRN for agitation  Discharge Planning: Social work and case management to assist with discharge  planning and identification of hospital follow-up needs prior to discharge Estimated LOS: 3-5 days Discharge Concerns: Need to establish a safety plan; Medication compliance and effectiveness Discharge Goals: Return home with outpatient referrals for mental health follow-up including medication management/psychotherapy  I certify that inpatient services furnished can reasonably be expected to improve the patient's condition.    Total Time Spent in Direct Patient Care:  I personally spent 45 minutes on the unit in direct patient care. The direct patient care time included face-to-face time with the patient, reviewing the patient's chart, communicating with other professionals, and coordinating care. Greater than 50% of this time was spent in counseling or coordinating care with the patient regarding goals of hospitalization, psycho-education, and discharge planning needs.   Donia Snell, NP 12/9/20252:46 PM

## 2024-12-04 NOTE — Group Note (Signed)
 Group Topic: Identity and Relationships  Group Date: 12/04/2024 Start Time: 1130 End Time: 1200 Facilitators: Lonzell Dwayne RAMAN, NT MHT 2 Department: [facility]  Number of Participants: 3  Group Focus: anger management Treatment Modality:  Patient-Centered Therapy Interventions utilized were exploration, problem solving, and story telling Purpose: enhance coping skills, express feelings, and increase insight  Name: Lance Walters Date of Birth: 25-Sep-1956  MR: [id number]    Level of Participation: Patient did attend group Quality of Participation: attentive Interactions with others: gave feedback Mood/Affect: positive Triggers (if applicable): N/A Cognition: coherent/clear Progress: Moderate Response: Appropriate  Plan: follow-up needed  Patients Problems:  Patient Active Problem List   Diagnosis Date Noted   Polysubstance abuse (HCC) 11/30/2024   Nicotine  dependence, cigarettes, uncomplicated 11/22/2023   Mixed hyperlipidemia 11/11/2023   Chest pain 11/10/2023   Hypokalemia 11/10/2023   Abnormal finding on echocardiogram 11/10/2023   Grade I diastolic dysfunction 11/10/2023   Polyp of colon 03/12/2022   Diverticulosis 03/12/2022   Incontinence of feces 12/14/2021   Left lower quadrant pain 12/14/2021   Pyuria 12/14/2021   Bright red stool 09/21/2021   Polyneuropathy, unspecified 02/04/2021   Abnormal renal function test 09/18/2020   Arthritis of right hip 09/18/2020   Neuropathy 09/18/2020   Spinal stenosis in cervical region 09/18/2020   Spinal stenosis of lumbosacral region 09/18/2020   Back pain 08/15/2020   Benign prostatic hyperplasia without lower urinary tract symptoms 08/15/2020   Cocaine use 08/15/2020   Essential hypertension 08/15/2020   Seizure disorder (HCC) 08/15/2020   Substance use disorder 08/15/2020

## 2024-12-04 NOTE — Care Management (Signed)
 [facility] Care Management...   Writer called the client's P.O., [employer].  Write informed her he will discharge tomorrow on 12/05/24 from  [facility].  Client will reside  at [facility] until he checks into [facility].    Jackee will present to [facility] on 12/06/24 for residential treatment.  [facility] is located at [place], [place] [address].     P.O. requested the client contact him after he leaves [facility] so she can meet with him face to face.  Writer informed the client to contact his P.O. tomorrow.

## 2024-12-04 NOTE — ED Notes (Signed)
 Patient denies pain and is resting comfortably.

## 2024-12-04 NOTE — Group Note (Signed)
 Group Topic: Emotional Regulation  Group Date: 12/04/2024 Start Time: 1100 End Time: 1150 Facilitators: Gerome Jolly, NT  Department: [facility]  Number of Participants: 4  Group Focus: anger management, feeling awareness/expression, relapse prevention, self-awareness, and substance abuse education Treatment Modality:  Cognitive Behavioral Therapy and Psychoeducation Interventions utilized were exploration Purpose: explore maladaptive thinking, express feelings, regain self-worth, and trigger / craving management  Name: Lance Walters Date of Birth: 07-23-1956  MR: [id number]    Level of Participation: active Quality of Participation: attentive and engaged Interactions with others: gave feedback Mood/Affect: appropriate, brightens with interaction, and positive Triggers (if applicable): Pt was able to identify triggers and potential obstacles  Cognition: coherent/clear and concrete Progress: Significant Response: Pt responded well to activity sheets. Patient identified concrete plan for his short and long-term future goals   Plan: referral / recommendations Patient has been accepted to [facility] for inpatient treatment.   Patients Problems:  Patient Active Problem List   Diagnosis Date Noted   Polysubstance abuse (HCC) 11/30/2024   Nicotine  dependence, cigarettes, uncomplicated 11/22/2023   Mixed hyperlipidemia 11/11/2023   Chest pain 11/10/2023   Hypokalemia 11/10/2023   Abnormal finding on echocardiogram 11/10/2023   Grade I diastolic dysfunction 11/10/2023   Polyp of colon 03/12/2022   Diverticulosis 03/12/2022   Incontinence of feces 12/14/2021   Left lower quadrant pain 12/14/2021   Pyuria 12/14/2021   Bright red stool 09/21/2021   Polyneuropathy, unspecified 02/04/2021   Abnormal renal function test 09/18/2020   Arthritis of right hip 09/18/2020   Neuropathy 09/18/2020   Spinal stenosis in cervical region 09/18/2020   Spinal  stenosis of lumbosacral region 09/18/2020   Back pain 08/15/2020   Benign prostatic hyperplasia without lower urinary tract symptoms 08/15/2020   Cocaine use 08/15/2020   Essential hypertension 08/15/2020   Seizure disorder (HCC) 08/15/2020   Substance use disorder 08/15/2020

## 2024-12-04 NOTE — Discharge Instructions (Signed)
 [facility] Care Management...  Client will discharge from [facility] on 12/05/24.  Client will drive himself to [facility] after his discharge from [facility] on 12/05/24.  Lance Walters will reside at [facility] until he checks into [facility] on 12/06/24.  Lance Walters will present to [facility] on 12/06/24 for residential treatment. Client will transport  Himself to [facility].    [facility] is located at [place], [place] [address].

## 2024-12-04 NOTE — ED Notes (Signed)
 Patient is in the bedroom calm and sleeping.NAD. Environment secured per policy. Will monitor for safety.

## 2024-12-04 NOTE — Care Management (Signed)
 FBC Care Management...  Client will discharge from [facility] on 12/05/24.  Client will drive himself to [facility] after his discharge from [facility] on 12/05/24.  Lance Walters will reside at [facility] until he checks into [facility] on 12/06/24.  Lance Walters will present to [facility] on 12/06/24 for residential treatment. Client will transport  Himself to [facility].    [facility] is located at [place], [place] [address].

## 2024-12-04 NOTE — ED Notes (Signed)
 Pt reports that prn MOM given earlier this morning for constipation is beginning to show its effectiveness, pt currently in the restroom.

## 2024-12-04 NOTE — Group Note (Signed)
 Group Topic: Understanding Self  Group Date: 12/04/2024 Start Time: 1210 End Time: 1230 Facilitators: Daved Tinnie HERO, RN  Department: [facility]  Number of Participants: 6  Group Focus: self-awareness Treatment Modality:  Psychoeducation Interventions utilized were exploration Purpose: increase insight  Name: Lance Walters Date of Birth: 06-30-1956  MR: [id number]    Level of Participation: moderate Quality of Participation: attentive and cooperative Interactions with others: gave feedback Mood/Affect: appropriate Triggers (if applicable): n/a Cognition: coherent/clear Progress: Gaining insight Response: pt expressed personal values that will help guide and shape future decisions and goals, including responsibility, spirituality, and honesty Plan: patient will be encouraged to future RN education groups  Patients Problems:  Patient Active Problem List   Diagnosis Date Noted   Polysubstance abuse (HCC) 11/30/2024   Nicotine  dependence, cigarettes, uncomplicated 11/22/2023   Mixed hyperlipidemia 11/11/2023   Chest pain 11/10/2023   Hypokalemia 11/10/2023   Abnormal finding on echocardiogram 11/10/2023   Grade I diastolic dysfunction 11/10/2023   Polyp of colon 03/12/2022   Diverticulosis 03/12/2022   Incontinence of feces 12/14/2021   Left lower quadrant pain 12/14/2021   Pyuria 12/14/2021   Bright red stool 09/21/2021   Polyneuropathy, unspecified 02/04/2021   Abnormal renal function test 09/18/2020   Arthritis of right hip 09/18/2020   Neuropathy 09/18/2020   Spinal stenosis in cervical region 09/18/2020   Spinal stenosis of lumbosacral region 09/18/2020   Back pain 08/15/2020   Benign prostatic hyperplasia without lower urinary tract symptoms 08/15/2020   Cocaine use 08/15/2020   Essential hypertension 08/15/2020   Seizure disorder (HCC) 08/15/2020   Substance use disorder 08/15/2020

## 2024-12-04 NOTE — Care Management (Signed)
 FBC Care Management...  Writer called [facility] again to check on the status of his bed.  Writer was finally able to reach Ms. Sherry at the front desk.  Writer informed Ms. Joen that Jackee wanted to see if his bed was still available at [facility].  She reports they did not know where he was at but as long as he presents back with a letterhead document stating he was in treatment, he can stay.    Writer completed the letter and provided it to the client and a copy will also be placed in his physical chart.  Please see a copy of the letter below:    December 04, 2024   To whom it may concern,   Tyse Auriemma has been hospitalized at [facility] from 11-29-24 until 12-05-24. Jackee will reside at [facility] until he checks into [facility].  Jackee will present to [facility] on 12/06/24 for residential treatment.  [facility] is located at [place], [place] [address].   If you have any further questions. Please feel free to contact me at office number [phone].   Thank you,  Jamiah Homeyer LCSW [address] [place], [place] [address]

## 2024-12-04 NOTE — ED Notes (Signed)
 Pt observed bed resting with eyes closed. Appears to be sleeping. Safety maintained.

## 2024-12-04 NOTE — ED Notes (Signed)
 Patient is in bedroom calm and composed. NAD Respirations even and unlabored. Will continue for safety.

## 2024-12-04 NOTE — Care Management (Incomplete)
 FBC Care Management...  Patient Patient has been accepted to [facility]

## 2024-12-04 NOTE — ED Notes (Signed)
 Pt presents as calm and pleasant. Pt says he feels 'fine', denies si hi and avh- verbal contract for safety provided. Pt c/o constipation, agreed to prn MOM with morning medications. Medications reviewed with pt, further questions denied, pt says he is supposed to be taking Keppra  r/t dx of epilepsy. RN notified provider, no seizure activity this shift thus far.

## 2024-12-05 NOTE — ED Notes (Addendum)
 Patient is in bedroom calm and composed. NAD Respirations even and unlabored. Will continue for safety.

## 2024-12-05 NOTE — ED Notes (Signed)
 Patient discharged this morning to [facility] house, and knows he is suppose to go to [facility]. D/c paperwork and instructions given. Educated patient to pick his prescribed medicine from his pharmacy.Assessment remained unchanged prior. Accompanied by MHT, to the changing room and to the entrance. Verbalized understanding of discharge instructions.

## 2024-12-10 ENCOUNTER — Ambulatory Visit: Attending: Cardiology | Admitting: Cardiology

## 2024-12-10 ENCOUNTER — Encounter: Payer: Self-pay | Admitting: Cardiology

## 2024-12-10 VITALS — BP 120/82 | HR 95 | Resp 16 | Ht 71.0 in | Wt 197.4 lb

## 2024-12-10 DIAGNOSIS — I10 Essential (primary) hypertension: Secondary | ICD-10-CM | POA: Diagnosis present

## 2024-12-10 DIAGNOSIS — F17210 Nicotine dependence, cigarettes, uncomplicated: Secondary | ICD-10-CM | POA: Diagnosis not present

## 2024-12-10 DIAGNOSIS — F1490 Cocaine use, unspecified, uncomplicated: Secondary | ICD-10-CM

## 2024-12-10 NOTE — Progress Notes (Signed)
 Cardiology Office Note:  .   Date:  12/10/2024  ID:  Lance Walters, DOB 1956-11-18, MRN [id number] PCP:  Campbell Reynolds, NP  Former Cardiology Providers: [facility] Providers  Chief Complaint  Patient presents with   Follow-up    Post-hospitalization.    History of Present Illness: .   Lance Walters is a 68 y.o. African-American male whose past medical history and cardiovascular risk factors includes: Hypertension, hyperlipidemia, polysubstance abuse (crack/cocaine), tobacco use.  Patient establish care during his ER visit/hospitalization in November 2024.  Cardiology was consulted for evaluation of an abnormal echocardiogram done at PCPs office.  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  And that he should go to the closest ER.  Patient presented to [facility] health at that time.  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  Initially scheduled for transesophageal echocardiogram given the primary concern at his recreational drugs however, after reviewing the case with other advanced cardiology imagers it was felt that there is no need to do a TEE.  His blood cultures were no growth to date x 2.  Urine culture was also no growth.  During that hospitalization patient also endorsed precordial pain felt to be noncardiac.  Recommended outpatient Myoview.  He was not a candidate for coronary CTA given the recent use of cocaine at that time.  However, post hospitalization he was in jail and was unable to follow-up.  Patient now presents to the office to resume care.  He denies anginal chest pain or heart failure symptoms. No near-syncope or syncopal events. Intermittent experiences lightheaded and dizziness with quick change in position Recent use of cocaine Continues to smoke  0.5 packs/day Plans to start rehab at [facility]  Review of Systems: .   Review of Systems  Cardiovascular:  Negative for chest pain, claudication, irregular heartbeat, leg swelling, near-syncope, orthopnea, palpitations, paroxysmal nocturnal dyspnea and syncope.  Respiratory:  Positive for cough. Negative for shortness of breath.   Hematologic/Lymphatic: Negative for bleeding problem.    Studies Reviewed:   EKG: EKG Interpretation Date/Time:  Monday December 10 2024 14:19:10 EST Ventricular Rate:  111 PR Interval:  166 QRS Duration:  76 QT Interval:  340 QTC Calculation: 462 R Axis:   -85  Text Interpretation: Sinus tachycardia Left anterior fascicular block Cannot rule out Inferior infarct (masked by fascicular block?) , age undetermined Anterior infarct , age undetermined When compared with ECG of 29-Nov-2024 23:41, Vent. rate has increased BY  44 BPM Minimal criteria for Inferior infarct are now Present Confirmed by Michele Richardson [phone]) on 12/10/2024 2:25:05 PM  Echocardiogram: 11/10/2023 1. 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   2. Left ventricular ejection fraction, by estimation, is 60 to 65%. The left ventricle has normal function. The left ventricle has no regional wall motion abnormalities. Left ventricular diastolic parameters are consistent with Grade I diastolic dysfunction (impaired relaxation).   3. Right ventricular systolic function is normal. The right ventricular size is normal. There is normal pulmonary artery systolic pressure.   4. The mitral valve is normal in structure. Mild mitral valve regurgitation. No evidence of mitral stenosis.   5. The aortic valve is tricuspid. Aortic valve regurgitation is not visualized. No aortic stenosis is present.   6. The inferior vena cava is dilated in size with >50%  respiratory variability, suggesting right atrial pressure of 8 mmHg.   Risk Assessment/Calculations:   NA   Labs:       Latest Ref Rng & Units 11/30/2024    12:07 AM 01/30/2024    9:49 PM 12/15/2023   10:29 AM  CBC  WBC 4.0 - 10.5 K/uL 9.0  7.1  9.3   Hemoglobin 13.0 - 17.0 g/dL 86.9  86.6  86.4   Hematocrit 39.0 - 52.0 % 39.9  41.7  39.8   Platelets 150 - 400 K/uL 238  193  184        Latest Ref Rng & Units 12/04/2024    1:45 PM 11/30/2024   12:07 AM 01/30/2024    9:49 PM  BMP  Glucose 70 - 99 mg/dL 79  892  83   BUN 8 - 23 mg/dL 13  21  14    Creatinine 0.61 - 1.24 mg/dL 8.87  8.68  8.80   Sodium 135 - 145 mmol/L 139  141  143   Potassium 3.5 - 5.1 mmol/L 4.4  4.4  3.3   Chloride 98 - 111 mmol/L 107  109  109   CO2 22 - 32 mmol/L 27  22  23    Calcium 8.9 - 10.3 mg/dL 9.0  8.6  8.7       Latest Ref Rng & Units 12/04/2024    1:45 PM 11/30/2024   12:07 AM 01/30/2024    9:49 PM  CMP  Glucose 70 - 99 mg/dL 79  892  83   BUN 8 - 23 mg/dL 13  21  14    Creatinine 0.61 - 1.24 mg/dL 8.87  8.68  8.80   Sodium 135 - 145 mmol/L 139  141  143   Potassium 3.5 - 5.1 mmol/L 4.4  4.4  3.3   Chloride 98 - 111 mmol/L 107  109  109   CO2 22 - 32 mmol/L 27  22  23    Calcium 8.9 - 10.3 mg/dL 9.0  8.6  8.7   Total Protein 6.5 - 8.1 g/dL 7.1  6.4  7.0   Total Bilirubin 0.0 - 1.2 mg/dL 0.6  0.4  0.9   Alkaline Phos 38 - 126 U/L 80  82  56   AST 15 - 41 U/L 19  17  18    ALT 0 - 44 U/L 16  12  12      Lab Results  Component Value Date   CHOL 155 11/30/2024   HDL 46 11/30/2024   LDLCALC 98 11/30/2024   TRIG 53 11/30/2024   CHOLHDL 3.4 11/30/2024   No results for input(s): LIPOA in the last 8760 hours. No components found for: NTPROBNP No results for input(s): PROBNP in the last 8760 hours. Recent Labs    11/30/24 0007  TSH 0.672    Physical Exam:    Today's Vitals   12/10/24 1416  BP: 120/82  Pulse: 95  Resp: 16  SpO2: 95%  Weight: 197 lb 6.4 oz (89.5 kg)  Height: 5' 11 (1.803 m)   Body mass index is 27.53 kg/m. Wt Readings from Last 3 Encounters:  12/10/24 197 lb 6.4 oz (89.5 kg)  12/15/23 161 lb (73 kg)  11/10/23 173  lb (78.5 kg)    Physical Exam  Constitutional: No distress.  hemodynamically stable  Neck: No JVD present.  Cardiovascular: Normal rate, regular rhythm, S1 normal and S2 normal. Exam reveals no gallop, no S3 and no S4.  No murmur heard. Pulmonary/Chest: Effort  normal and breath sounds normal. No stridor. He has no wheezes. He has no rales.  Musculoskeletal:        General: No edema.     Cervical back: Neck supple.  Skin: Skin is warm.   Impression & Recommendation(s):  Impression:   ICD-10-CM   1. Essential hypertension  I10 EKG 12-Lead    2. Cocaine use  F14.90     3. Cigarette smoker  F17.210        Recommendation(s):  Essential hypertension Office blood pressures are well-controlled. Continue amlodipine  10 mg p.o. daily Cardiology following peripherally, managed by primary care provider.  Cocaine use Continues to use cocaine. Likely cause of borderline elevated heart rate/tachycardia. Motivated with regards to complete cessation and plans to enroll into rehab facility at [facility]  Cigarette smoker Tobacco cessation counseling: Currently smoking 0.5 packs/day   Patient denies claudication Patient is informed to follow-up with PCP and consider lung cancer screening if and when appropriate. He is informed of the dangers of tobacco abuse including stroke, cancer, and MI, as well as benefits of tobacco cessation. He is willing to quit at this time. 7 mins were spent counseling patient cessation techniques. We discussed various methods to help quit smoking, including deciding on a date to quit, joining a support group, pharmacological agents- nicotine  gum/patch/lozenges.  I will reassess his progress at the next follow-up visit  Orders Placed:  Orders Placed This Encounter  Procedures   EKG 12-Lead     Final Medication List:   No orders of the defined types were placed in this encounter.   Medications Discontinued During This Encounter  Medication Reason    amLODipine  (NORVASC ) 5 MG tablet Dose change    Current Medications[1]  Consent:   NA  Disposition:   As needed  His questions and concerns were addressed to his satisfaction. He voices understanding of the recommendations provided during this encounter.    Signed, Madonna Large, DO, [facility]  A Division of  [facility] [address], [place], [place] [address]  12/10/2024 2:56 PM     [1]  Current Outpatient Medications:    albuterol  (VENTOLIN  HFA) 108 (90 Base) MCG/ACT inhaler, Inhale 2 puffs into the lungs every 4 (four) hours as needed for wheezing or shortness of breath., Disp: 1 each, Rfl: 0   amLODipine  (NORVASC ) 10 MG tablet, Take 10 mg by mouth daily., Disp: , Rfl:    docusate sodium  (COLACE) 100 MG capsule, Take 1 capsule (100 mg total) by mouth daily., Disp: 30 capsule, Rfl: 0   FLUoxetine  (PROZAC ) 10 MG capsule, Take 1 capsule (10 mg total) by mouth daily., Disp: 30 capsule, Rfl: 0   gabapentin  (NEURONTIN ) 100 MG capsule, Take 1 capsule (100 mg total) by mouth 2 (two) times daily., Disp: 60 capsule, Rfl: 0   INCRUSE ELLIPTA 62.5 MCG/ACT AEPB, Inhale 1 puff into the lungs daily., Disp: , Rfl:    levETIRAcetam  (KEPPRA ) 500 MG tablet, Take 1 tablet (500 mg total) by mouth 2 (two) times daily., Disp: 60 tablet, Rfl: 0   nitroGLYCERIN  (NITROSTAT ) 0.4 MG SL tablet, Place 0.4 mg under the tongue every 5 (five) minutes as needed for chest pain., Disp: , Rfl:    polyethylene glycol (MIRALAX  / GLYCOLAX ) 17 g packet, Take 17 g by mouth daily as needed for severe constipation or moderate constipation., Disp: 14 each, Rfl: 0   prazosin (MINIPRESS) 1 MG capsule, Take 1 mg by mouth at bedtime., Disp: , Rfl:  tamsulosin  (FLOMAX ) 0.4 MG CAPS capsule, Take 0.4 mg by mouth. (Patient taking differently: Take 0.4 mg by mouth daily.), Disp: , Rfl:    traZODone  (DESYREL ) 50 MG tablet, Take 1 tablet (50 mg total) by mouth at bedtime as needed for sleep., Disp:  30 tablet, Rfl: 0

## 2024-12-10 NOTE — Patient Instructions (Signed)
 Medication Instructions:  Your physician recommends that you continue on your current medications as directed. Please refer to the Current Medication list given to you today.  *If you need a refill on your cardiac medications before your next appointment, please call your pharmacy*  Lab Work: None ordered If you have labs (blood work) drawn today and your tests are completely normal, you will receive your results only by: MyChart Message (if you have MyChart) OR A paper copy in the mail If you have any lab test that is abnormal or we need to change your treatment, we will call you to review the results.  Testing/Procedures: None ordered  Follow-Up: At [facility], you and your health needs are our priority.  As part of our continuing mission to provide you with exceptional heart care, our providers are all part of one team.  This team includes your primary Cardiologist (physician) and Advanced Practice Providers or APPs (Physician Assistants and Nurse Practitioners) who all work together to provide you with the care you need, when you need it.  Your next appointment:    As needed   We recommend signing up for the patient portal called MyChart.  Sign up information is provided on this After Visit Summary.  MyChart is used to connect with patients for Virtual Visits (Telemedicine).  Patients are able to view lab/test results, encounter notes, upcoming appointments, etc.  Non-urgent messages can be sent to your provider as well.   To learn more about what you can do with MyChart, go to [email or web address].
